# Patient Record
Sex: Female | Born: 1970
Health system: Southern US, Community
[De-identification: ages and names within clinical notes are randomized; demographics above are authoritative.]

## PROBLEM LIST (undated history)

## (undated) DIAGNOSIS — E669 Obesity, unspecified: Secondary | ICD-10-CM

## (undated) DIAGNOSIS — E039 Hypothyroidism, unspecified: Secondary | ICD-10-CM

## (undated) DIAGNOSIS — M2550 Pain in unspecified joint: Secondary | ICD-10-CM

## (undated) DIAGNOSIS — M779 Enthesopathy, unspecified: Secondary | ICD-10-CM

## (undated) DIAGNOSIS — N946 Dysmenorrhea, unspecified: Secondary | ICD-10-CM

## (undated) DIAGNOSIS — M722 Plantar fascial fibromatosis: Secondary | ICD-10-CM

## (undated) DIAGNOSIS — J309 Allergic rhinitis, unspecified: Secondary | ICD-10-CM

## (undated) DIAGNOSIS — E785 Hyperlipidemia, unspecified: Secondary | ICD-10-CM

## (undated) HISTORY — DX: Hypothyroidism, unspecified: E03.9

## (undated) HISTORY — DX: Pain in unspecified joint: M25.50

## (undated) HISTORY — DX: Allergic rhinitis, unspecified: J30.9

## (undated) HISTORY — DX: Plantar fascial fibromatosis: M72.2

## (undated) HISTORY — DX: Hyperlipidemia, unspecified: E78.5

## (undated) HISTORY — DX: Dysmenorrhea, unspecified: N94.6

## (undated) HISTORY — PX: WISDOM TOOTH EXTRACTION: SHX21

## (undated) HISTORY — DX: Enthesopathy, unspecified: M77.9

## (undated) HISTORY — DX: Obesity, unspecified: E66.9

---

## 2015-11-24 ENCOUNTER — Other Ambulatory Visit: Payer: 59

## 2016-01-17 ENCOUNTER — Encounter: Payer: Self-pay | Admitting: Family

## 2016-01-18 ENCOUNTER — Other Ambulatory Visit: Payer: Self-pay | Admitting: Family

## 2016-01-18 DIAGNOSIS — Z1239 Encounter for other screening for malignant neoplasm of breast: Secondary | ICD-10-CM

## 2016-02-08 ENCOUNTER — Encounter: Payer: Self-pay | Admitting: Family

## 2016-02-08 ENCOUNTER — Ambulatory Visit (INDEPENDENT_AMBULATORY_CARE_PROVIDER_SITE_OTHER): Payer: 59 | Admitting: Family

## 2016-02-08 VITALS — BP 107/71 | HR 71 | Temp 97.3°F | Ht 63.0 in | Wt 249.0 lb

## 2016-02-08 DIAGNOSIS — Z6841 Body Mass Index (BMI) 40.0 and over, adult: Secondary | ICD-10-CM

## 2016-02-08 DIAGNOSIS — E039 Hypothyroidism, unspecified: Secondary | ICD-10-CM | POA: Diagnosis not present

## 2016-02-08 DIAGNOSIS — Z0000 Encounter for general adult medical examination without abnormal findings: Secondary | ICD-10-CM | POA: Diagnosis not present

## 2016-02-08 DIAGNOSIS — F5081 Binge eating disorder: Secondary | ICD-10-CM | POA: Insufficient documentation

## 2016-02-08 DIAGNOSIS — J301 Allergic rhinitis due to pollen: Secondary | ICD-10-CM | POA: Diagnosis not present

## 2016-02-08 DIAGNOSIS — J309 Allergic rhinitis, unspecified: Secondary | ICD-10-CM | POA: Insufficient documentation

## 2016-02-08 NOTE — Patient Instructions (Signed)

## 2016-02-08 NOTE — Progress Notes (Signed)
   Subjective:    Patient ID: Chloe Byrd, female    DOB: March 31, 1970, 45 y.o.   MRN: [id number]  PT presents to the office today to establish care and CPE without pap.  Thyroid Problem  Presents for follow-up visit. Patient reports no anxiety, depressed mood, diaphoresis, dry skin or hair loss. The symptoms have been stable.  Binge Eating Disorder PT currently taking Vyvanse 70 mg and is working well.  Allergic Rhinitis PT currently taking Singulair 10 mg and is stable.    Review of Systems  Constitutional: Negative for diaphoresis.  Psychiatric/Behavioral: The patient is not nervous/anxious.   All other systems reviewed and are negative.  Social History   Social History  . Marital status: Married    Spouse name: N/A  . Number of children: N/A  . Years of education: N/A   Social History Main Topics  . Smoking status: Never Smoker  . Smokeless tobacco: Never Used  . Alcohol use No  . Drug use: No  . Sexual activity: Yes   Other Topics Concern  . None   Social History Narrative  . None    Family History  Problem Relation Age of Onset  . Arthritis Mother   . Hypertension Mother   . Diabetes Father   . Alcohol abuse Father   . Depression Father        Objective:   Physical Exam  Constitutional: She is oriented to person, place, and time. She appears well-developed and well-nourished. No distress.  HENT:  Head: Normocephalic and atraumatic.  Right Ear: External ear normal.  Left Ear: External ear normal.  Nose: Nose normal.  Mouth/Throat: Oropharynx is clear and moist.  Eyes: Pupils are equal, round, and reactive to light.  Neck: Normal range of motion. Neck supple. No thyromegaly present.  Cardiovascular: Normal rate, regular rhythm, normal heart sounds and intact distal pulses.   No murmur heard. Pulmonary/Chest: Effort normal and breath sounds normal. No respiratory distress. She has no wheezes.  Abdominal: Soft. Bowel sounds are normal. She  exhibits no distension. There is no tenderness.  Musculoskeletal: Normal range of motion. She exhibits no edema or tenderness.  Neurological: She is alert and oriented to person, place, and time. She has normal reflexes. No cranial nerve deficit.  Skin: Skin is warm and dry.  Psychiatric: She has a normal mood and affect. Her behavior is normal. Judgment and thought content normal.  Vitals reviewed.     BP 107/71   Pulse 71   Temp 97.3 F (36.3 C) (Oral)   Ht 5' 3" (1.6 m)   Wt 249 lb (112.9 kg)   BMI 44.11 kg/m      Assessment & Plan:  1. Hypothyroidism, unspecified type - CMP14+EGFR - Thyroid Panel With TSH  2. Morbid obesity with BMI of 40.0-44.9, adult (HCC) - CMP14+EGFR  3. Binge eating disorder - CMP14+EGFR  4. Non-seasonal allergic rhinitis due to pollen, unspecified chronicity - CMP14+EGFR  5. Annual physical exam - CMP14+EGFR - Thyroid Panel With TSH - CBC with Differential/Platelet - Lipid panel - VITAMIN D 25 Hydroxy (Vit-D Deficiency, Fractures)   Continue all meds Labs pending Health Maintenance reviewed Diet and exercise encouraged RTO 6 months  Evelina Dun, FNP

## 2016-02-09 ENCOUNTER — Other Ambulatory Visit: Payer: Self-pay | Admitting: Family

## 2016-02-09 DIAGNOSIS — E559 Vitamin D deficiency, unspecified: Secondary | ICD-10-CM

## 2016-02-09 DIAGNOSIS — E785 Hyperlipidemia, unspecified: Secondary | ICD-10-CM

## 2016-02-09 LAB — VITAMIN D 25 HYDROXY (VIT D DEFICIENCY, FRACTURES): Vit D, 25-Hydroxy: 11.9 ng/mL — ABNORMAL LOW (ref 30.0–100.0)

## 2016-02-09 LAB — CMP14+EGFR
ALBUMIN: 4.4 g/dL (ref 3.5–5.5)
ALK PHOS: 85 IU/L (ref 39–117)
ALT: 16 IU/L (ref 0–32)
AST: 15 IU/L (ref 0–40)
Albumin/Globulin Ratio: 1.7 (ref 1.2–2.2)
BUN / CREAT RATIO: 11 (ref 9–23)
BUN: 8 mg/dL (ref 6–24)
Bilirubin Total: 0.4 mg/dL (ref 0.0–1.2)
CALCIUM: 9.3 mg/dL (ref 8.7–10.2)
CO2: 23 mmol/L (ref 18–29)
CREATININE: 0.73 mg/dL (ref 0.57–1.00)
Chloride: 102 mmol/L (ref 96–106)
GFR calc Af Amer: 114 mL/min/{1.73_m2} (ref >59)
GFR, EST NON AFRICAN AMERICAN: 99 mL/min/{1.73_m2} (ref >59)
GLUCOSE: 83 mg/dL (ref 65–99)
Globulin, Total: 2.6 g/dL (ref 1.5–4.5)
Potassium: 4.7 mmol/L (ref 3.5–5.2)
Sodium: 141 mmol/L (ref 134–144)
Total Protein: 7 g/dL (ref 6.0–8.5)

## 2016-02-09 LAB — CBC WITH DIFFERENTIAL/PLATELET
BASOS ABS: 0 10*3/uL (ref 0.0–0.2)
Basos: 0 %
EOS (ABSOLUTE): 0.3 10*3/uL (ref 0.0–0.4)
Eos: 2 %
Hematocrit: 39.4 % (ref 34.0–46.6)
Hemoglobin: 13.8 g/dL (ref 11.1–15.9)
Immature Grans (Abs): 0 10*3/uL (ref 0.0–0.1)
Immature Granulocytes: 0 %
LYMPHS ABS: 3.4 10*3/uL — AB (ref 0.7–3.1)
Lymphs: 31 %
MCH: 29.8 pg (ref 26.6–33.0)
MCHC: 35 g/dL (ref 31.5–35.7)
MCV: 85 fL (ref 79–97)
MONOCYTES: 5 %
MONOS ABS: 0.5 10*3/uL (ref 0.1–0.9)
Neutrophils Absolute: 6.6 10*3/uL (ref 1.4–7.0)
Neutrophils: 62 %
Platelets: 367 10*3/uL (ref 150–379)
RBC: 4.63 x10E6/uL (ref 3.77–5.28)
RDW: 14.3 % (ref 12.3–15.4)
WBC: 10.8 10*3/uL (ref 3.4–10.8)

## 2016-02-09 LAB — LIPID PANEL
CHOLESTEROL TOTAL: 272 mg/dL — AB (ref 100–199)
Chol/HDL Ratio: 5.4 ratio units — ABNORMAL HIGH (ref 0.0–4.4)
HDL: 50 mg/dL (ref >39)
LDL Calculated: 174 mg/dL — ABNORMAL HIGH (ref 0–99)
TRIGLYCERIDES: 240 mg/dL — AB (ref 0–149)
VLDL Cholesterol Cal: 48 mg/dL — ABNORMAL HIGH (ref 5–40)

## 2016-02-09 LAB — THYROID PANEL WITH TSH
FREE THYROXINE INDEX: 2.6 (ref 1.2–4.9)
T3 Uptake Ratio: 25 % (ref 24–39)
T4 TOTAL: 10.3 ug/dL (ref 4.5–12.0)
TSH: 1.65 u[IU]/mL (ref 0.450–4.500)

## 2016-02-09 MED ORDER — VITAMIN D (ERGOCALCIFEROL) 1.25 MG (50000 UNIT) PO CAPS: 50000.0000 [IU] | ORAL_CAPSULE | ORAL | 3 refills | Status: DC

## 2016-02-09 MED ORDER — ATORVASTATIN CALCIUM 20 MG PO TABS: 20.0000 mg | ORAL_TABLET | Freq: Every day | ORAL | 3 refills | Status: DC

## 2016-02-20 ENCOUNTER — Other Ambulatory Visit: Payer: Self-pay | Admitting: Family

## 2016-02-20 DIAGNOSIS — N63 Unspecified lump in unspecified breast: Secondary | ICD-10-CM

## 2016-02-20 DIAGNOSIS — N6320 Unspecified lump in the left breast, unspecified quadrant: Secondary | ICD-10-CM

## 2016-02-29 ENCOUNTER — Encounter (INDEPENDENT_AMBULATORY_CARE_PROVIDER_SITE_OTHER): Payer: 59 | Admitting: Family Medicine

## 2016-03-01 ENCOUNTER — Other Ambulatory Visit: Payer: Self-pay | Admitting: Family

## 2016-03-01 MED ORDER — NORGESTREL-ETHINYL ESTRADIOL 0.3-30 MG-MCG PO TABS: 1.0000 | ORAL_TABLET | Freq: Every day | ORAL | 3 refills | Status: DC

## 2016-03-01 MED ORDER — ATORVASTATIN CALCIUM 20 MG PO TABS: 20.0000 mg | ORAL_TABLET | Freq: Every day | ORAL | 3 refills | Status: DC

## 2016-03-01 MED ORDER — MONTELUKAST SODIUM 10 MG PO TABS: 10.0000 mg | ORAL_TABLET | Freq: Every day | ORAL | 3 refills | Status: DC

## 2016-03-01 MED ORDER — LEVOTHYROXINE SODIUM 200 MCG PO TABS: 200.0000 ug | ORAL_TABLET | Freq: Every day | ORAL | 3 refills | Status: DC

## 2016-03-01 MED FILL — MONTELUKAST SOD 10 MG TAB: 10 | 90 days supply | Qty: 90 | Fill #0

## 2016-03-01 MED FILL — ELINEST-28 TABLET: 0.3-30 | 84 days supply | Qty: 84 | Fill #0

## 2016-03-01 MED FILL — VIT D2 1.25 MG (50,000 UNIT: 1.25 MG | 84 days supply | Qty: 12 | Fill #0

## 2016-03-01 MED FILL — LEVOTHYROXINE 200 MCG TAB: 200 | 90 days supply | Qty: 90 | Fill #0

## 2016-03-01 MED FILL — ATORVASTATIN 20 MG TABLET: 20 | 90 days supply | Qty: 90 | Fill #0

## 2016-03-05 ENCOUNTER — Encounter (HOSPITAL_COMMUNITY): Payer: Self-pay

## 2016-03-12 ENCOUNTER — Encounter (HOSPITAL_COMMUNITY): Payer: Self-pay

## 2016-03-20 ENCOUNTER — Encounter (INDEPENDENT_AMBULATORY_CARE_PROVIDER_SITE_OTHER): Payer: 59 | Admitting: Family Medicine

## 2016-03-26 ENCOUNTER — Encounter (INDEPENDENT_AMBULATORY_CARE_PROVIDER_SITE_OTHER): Payer: Self-pay | Admitting: Family Medicine

## 2016-03-26 ENCOUNTER — Ambulatory Visit (INDEPENDENT_AMBULATORY_CARE_PROVIDER_SITE_OTHER): Payer: 59 | Admitting: Family Medicine

## 2016-03-26 VITALS — BP 112/74 | HR 69 | Temp 98.4°F | Resp 13 | Ht 63.0 in | Wt 254.0 lb

## 2016-03-26 DIAGNOSIS — E039 Hypothyroidism, unspecified: Secondary | ICD-10-CM

## 2016-03-26 DIAGNOSIS — Z1389 Encounter for screening for other disorder: Secondary | ICD-10-CM | POA: Diagnosis not present

## 2016-03-26 DIAGNOSIS — R5383 Other fatigue: Secondary | ICD-10-CM

## 2016-03-26 DIAGNOSIS — E782 Mixed hyperlipidemia: Secondary | ICD-10-CM | POA: Diagnosis not present

## 2016-03-26 DIAGNOSIS — R0602 Shortness of breath: Secondary | ICD-10-CM

## 2016-03-26 DIAGNOSIS — E559 Vitamin D deficiency, unspecified: Secondary | ICD-10-CM | POA: Diagnosis not present

## 2016-03-26 DIAGNOSIS — Z9189 Other specified personal risk factors, not elsewhere classified: Secondary | ICD-10-CM | POA: Diagnosis not present

## 2016-03-26 DIAGNOSIS — Z1331 Encounter for screening for depression: Secondary | ICD-10-CM

## 2016-03-26 DIAGNOSIS — Z0289 Encounter for other administrative examinations: Secondary | ICD-10-CM

## 2016-03-26 NOTE — Progress Notes (Signed)
Office: [phone]  /  Fax: [phone]   HPI:   Chief Complaint: OBESITY  Chloe Byrd (MR# [id number]) is a 46 y.o. female who presents on 03/26/2016 for obesity evaluation and treatment. Current BMI is Body mass index is 44.99 kg/m.Marland Kitchen. Chloe Byrd has struggled with obesity for years and has been unsuccessful in either losing weight or maintaining long term weight loss. Chloe Byrd attended our information session and states she is currently in the action stage of change and ready to dedicate time achieving and maintaining a healthier weight.  Chloe Byrd states her family eats meals together she thinks her family will eat healthier with  her she struggles with family and or coworkers weight loss sabotage her desired weight is 160 she has been heavy most of  her life she started gaining weight after [employer] her heaviest weight ever was 255 lbs. she has significant food cravings issues  she snacks frequently in the evenings she skips meals frequently she is frequently drinking liquids with calories she frequently makes poor food choices she has problems with excessive hunger  she frequently eats larger portions than normal  she has binge eating behaviors she struggles with emotional eating    Fatigue Chloe Byrd feels her energy is lower than it should be. This has worsened with weight gain and has not worsened recently. Chloe Byrd denies daytime somnolence and  denies waking up still tired. Patient is at risk for obstructive sleep apnea. Patient generally gets 6 or 7 hours of sleep per night, and states they generally have generally restful sleep. Snoring is not present. Apneic episodes are not present. Epworth Sleepiness Score is 3  Dyspnea on exertion Chloe Byrd notes increasing shortness of breath with exercising and seems to be worsening over time with weight gain. She notes getting out of breath sooner with activity than she used to. This has not gotten worse recently. Chloe Byrd denies  orthopnea.  Vitamin D deficiency Chloe Byrd has a diagnosis of vitamin D deficiency. Admits fatigue, last level was very low at 11.9, She is currently taking vit D and denies nausea, vomiting or muscle weakness.  Hyperlipidemia Chloe Byrd has mixed hyperlipidemia, last LDL elevated at 174, triglycerides elevated at 240 and has been trying to improve her cholesterol levels with intensive lifestyle modification including a low saturated fat diet, exercise and weight loss. Is currently on Lipitor. She denies any chest pain, claudication or myalgias.  Hypothyroid Continued on Levothyroxine, denies heat/cold intolerance.  Depression Screen Chloe Byrd's Food and Mood (modified PHQ-9) score was  Depression screen PHQ 2/9 03/26/2016  Decreased Interest 1  Down, Depressed, Hopeless 3  PHQ - 2 Score 4  Altered sleeping 3  Tired, decreased energy 3  Change in appetite 2  Feeling bad or failure about yourself  0  Trouble concentrating 0  Moving slowly or fidgety/restless 0  Suicidal thoughts 0  PHQ-9 Score 12    ALLERGIES: Allergies  Allergen Reactions  . Hydrocodone     MEDICATIONS: Current Outpatient Prescriptions on File Prior to Visit  Medication Sig Dispense Refill  . atorvastatin (LIPITOR) 20 MG tablet Take 1 tablet (20 mg total) by mouth daily. 90 tablet 3  . levothyroxine (SYNTHROID, LEVOTHROID) 200 MCG tablet Take 1 tablet (200 mcg total) by mouth daily before breakfast. 90 tablet 3  . montelukast (SINGULAIR) 10 MG tablet Take 1 tablet (10 mg total) by mouth at bedtime. 90 tablet 3  . norgestrel-ethinyl estradiol (LO/OVRAL,CRYSELLE) 0.3-30 MG-MCG tablet Take 1 tablet by mouth daily. 3 Package 3  .  Vitamin D, Ergocalciferol, (DRISDOL) 50000 units CAPS capsule Take 1 capsule (50,000 Units total) by mouth every 7 (seven) days. 12 capsule 3  . lisdexamfetamine (VYVANSE) 70 MG capsule Take 70 mg by mouth daily.     No current facility-administered medications on file prior to visit.     PAST  MEDICAL HISTORY: Past Medical History:  Diagnosis Date  . Allergic rhinitis   . Dysmenorrhea   . Hyperlipidemia   . Hypothyroidism   . Joint pain   . Obesity   . Plantar fasciitis   . Tendonitis     PAST SURGICAL HISTORY: Past Surgical History:  Procedure Laterality Date  . CESAREAN SECTION  2008  . WISDOM TOOTH EXTRACTION     age 78    SOCIAL HISTORY: Social History  Substance Use Topics  . Smoking status: Never Smoker  . Smokeless tobacco: Never Used  . Alcohol use No    FAMILY HISTORY: Family History  Problem Relation Age of Onset  . Arthritis Mother   . Hypertension Mother   . Anxiety disorder Mother   . Sleep apnea Mother   . Diabetes Father   . Alcohol abuse Father   . Depression Father   . Hyperlipidemia Father     ROS: Review of Systems  Constitutional: Positive for malaise/fatigue.  HENT:       Hay Fever  Eyes: Positive for redness.       Wear Glasses or Contacts  Cardiovascular:       Dyspnea on Exertion  Gastrointestinal: Positive for diarrhea and heartburn.  Musculoskeletal: Positive for joint pain and myalgias.       Breast Lumps  Skin:       Hair or Nail Changes  Psychiatric/Behavioral: The patient has insomnia.        Stress    PHYSICAL EXAM: Blood pressure 112/74, pulse 69, temperature 98.4 F (36.9 C), temperature source Oral, resp. rate 13, height 5\' 3"  (1.6 m), weight 254 lb (115.2 kg), last menstrual period 03/25/2016, SpO2 98 %. Body mass index is 44.99 kg/m. Physical Exam  Constitutional: She is oriented to person, place, and time. She appears well-developed and well-nourished.  HENT:  Head: Normocephalic and atraumatic.  Nose: Nose normal.  Eyes: EOM are normal. No scleral icterus.  Neck: Normal range of motion. Neck supple. No thyromegaly present.  Cardiovascular: Normal rate and regular rhythm.   Pulmonary/Chest: No respiratory distress.  Abdominal: Soft. There is no tenderness.  Obesity  Musculoskeletal: Normal  range of motion. She exhibits edema.  Neurological: She is alert and oriented to person, place, and time. Coordination normal.  Skin: Skin is warm and dry.  Psychiatric: She has a normal mood and affect. Her behavior is normal.    RECENT LABS AND TESTS: BMET    Component Value Date/Time   NA 141 02/08/2016 1026   K 4.7 02/08/2016 1026   CL 102 02/08/2016 1026   CO2 23 02/08/2016 1026   GLUCOSE 83 02/08/2016 1026   BUN 8 02/08/2016 1026   CREATININE 0.73 02/08/2016 1026   CALCIUM 9.3 02/08/2016 1026   GFRNONAA 99 02/08/2016 1026   GFRAA 114 02/08/2016 1026   No results found for: HGBA1C No results found for: INSULIN CBC    Component Value Date/Time   WBC 10.8 02/08/2016 1026   RBC 4.63 02/08/2016 1026   HCT 39.4 02/08/2016 1026   PLT 367 02/08/2016 1026   MCV 85 02/08/2016 1026   MCH 29.8 02/08/2016 1026   MCHC 35.0 02/08/2016  1026   RDW 14.3 02/08/2016 1026   LYMPHSABS 3.4 (H) 02/08/2016 1026   EOSABS 0.3 02/08/2016 1026   BASOSABS 0.0 02/08/2016 1026   Iron/TIBC/Ferritin/ %Sat No results found for: IRON, TIBC, FERRITIN, IRONPCTSAT Lipid Panel     Component Value Date/Time   CHOL 272 (H) 02/08/2016 1026   TRIG 240 (H) 02/08/2016 1026   HDL 50 02/08/2016 1026   CHOLHDL 5.4 (H) 02/08/2016 1026   LDLCALC 174 (H) 02/08/2016 1026   Hepatic Function Panel     Component Value Date/Time   PROT 7.0 02/08/2016 1026   ALBUMIN 4.4 02/08/2016 1026   AST 15 02/08/2016 1026   ALT 16 02/08/2016 1026   ALKPHOS 85 02/08/2016 1026   BILITOT 0.4 02/08/2016 1026      Component Value Date/Time   TSH 1.650 02/08/2016 1026    ECG  shows NSR with a rate of 63 BPM INDIRECT CALORIMETER done today shows a VO2 of 221 and a REE of 1535.    ASSESSMENT AND PLAN: Other fatigue - Plan: EKG 12-Lead, Comprehensive metabolic panel, CBC with Differential/Platelet, Hemoglobin A1c, Insulin, random, VITAMIN D 25 Hydroxy (Vit-D Deficiency, Fractures), Vitamin B12, Folate, T3, T4, free,  TSH  Shortness of breath - Plan: Lipid Panel With LDL/HDL Ratio  Vitamin D deficiency - Plan: VITAMIN D 25 Hydroxy (Vit-D Deficiency, Fractures)  Mixed hyperlipidemia - Plan: Lipid Panel With LDL/HDL Ratio  Hypothyroidism, unspecified type  At risk for diabetes mellitus  Depression screening  Morbid obesity (HCC)  PLAN:  Fatigue Chloe Byrd was informed that her fatigue may be related to obesity, depression or many other causes. Labs will be ordered, and in the meanwhile Chloe Byrd has agreed to work on diet, exercise and weight loss to help with fatigue. Proper sleep hygiene was discussed including the need for 7-8 hours of quality sleep each night. A sleep study was not ordered based on symptoms and Epworth score.  Exercise Induced Shortness of Breath Chloe Byrd's shortness of breath appears to be obesity related and exercise induced. She has agreed to work on weight loss and gradually increase exercise to treat her exercise induced shortness of breath. If Chloe Byrd follows our instructions and loses weight without improvement of her shortness of breath, we will plan to refer to pulmonology. We will monitor this condition regularly. Chloe Byrd agrees to this plan.  Vitamin D Deficiency Chloe Byrd was informed that low vitamin D levels contributes to fatigue and are associated with obesity, breast, and colon cancer. She agrees to continue to take prescription Vit D @50 ,000 IU every week, will check labs and will follow up for routine testing of vitamin D, at least 2-3 times per year. She was informed of the risk of over-replacement of vitamin D and agrees to not increase her dose unless he discusses this with [place] first.  Hyperlipidemia Chloe Byrd was informed of the American Heart Association Guidelines emphasize intensive lifestyle modifications as the first line treatment for hyperlipidemia. We discussed many lifestyle modifications today in depth, and Chloe Byrd will continue to work on decreasing saturated fats such as  fatty red meat, butter and many fried foods. She will also increase vegetables and lean protein in her diet and continue to work on exercise and her weight loss efforts. Will check labs.  Hypothyroid  Will continue on Levothyroxine and check labs.  Depression Screen Chloe Byrd had a positive depression screening. Depression is commonly associated with obesity and often results in emotional eating behaviors. We will monitor this closely and work on CBT to help  improve the non-hunger eating patterns. Referral to Psychology may be required if no improvement is seen as she continues in our clinic.  Diabetes risk counselling Chloe Byrd was given extended (at least 15 minutes) diabetes prevention counseling today. She is 46 y.o. female and has risk factors for diabetes including obesity. We discussed intensive lifestyle modifications today with an emphasis on weight loss as well as increasing exercise and decreasing simple carbohydrates in her diet.  Obesity Chloe Byrd is currently in the action stage of change and her goal is to continue with weight loss efforts She has agreed to follow the Category 2 plan +100 calories. Chloe Byrd has been instructed to work up to a goal of 150 minutes of combined cardio and strengthening exercise per week for weight loss and overall health benefits. We discussed the following Behavioral Modification Stratagies today: increasing lean protein intake, decreasing simple carbohydrates , increasing vegetables and work on meal planning and easy cooking plans  Chloe Byrd has agreed to follow up with our clinic in 2 weeks. She was informed of the importance of frequent follow up visits to maximize her success with intensive lifestyle modifications for her multiple health conditions. She was informed we would discuss her lab results at her next visit unless there is a critical issue that needs to be addressed sooner. Chloe Byrd agreed to keep her next visit at the agreed upon time to discuss these  results.  I, [place] Crane, am acting as scribe for Quillian Quince, MD  I have reviewed the above documentation for accuracy and completeness, and I agree with the above. -Quillian Quince, MD

## 2016-03-27 LAB — COMPREHENSIVE METABOLIC PANEL
ALT: 14 IU/L (ref 0–32)
AST: 10 IU/L (ref 0–40)
Albumin/Globulin Ratio: 1.7 (ref 1.2–2.2)
Albumin: 4.5 g/dL (ref 3.5–5.5)
Alkaline Phosphatase: 80 IU/L (ref 39–117)
BUN/Creatinine Ratio: 15 (ref 9–23)
BUN: 12 mg/dL (ref 6–24)
Bilirubin Total: 0.3 mg/dL (ref 0.0–1.2)
CALCIUM: 9.2 mg/dL (ref 8.7–10.2)
CO2: 24 mmol/L (ref 18–29)
CREATININE: 0.79 mg/dL (ref 0.57–1.00)
Chloride: 99 mmol/L (ref 96–106)
GFR, EST AFRICAN AMERICAN: 104 mL/min/{1.73_m2} (ref >59)
GFR, EST NON AFRICAN AMERICAN: 90 mL/min/{1.73_m2} (ref >59)
GLUCOSE: 81 mg/dL (ref 65–99)
Globulin, Total: 2.7 g/dL (ref 1.5–4.5)
Potassium: 4.2 mmol/L (ref 3.5–5.2)
Sodium: 141 mmol/L (ref 134–144)
TOTAL PROTEIN: 7.2 g/dL (ref 6.0–8.5)

## 2016-03-27 LAB — HEMOGLOBIN A1C
ESTIMATED AVERAGE GLUCOSE: 120 mg/dL
HEMOGLOBIN A1C: 5.8 % — AB (ref 4.8–5.6)

## 2016-03-27 LAB — LIPID PANEL WITH LDL/HDL RATIO
CHOLESTEROL TOTAL: 191 mg/dL (ref 100–199)
HDL: 49 mg/dL (ref >39)
LDL CALC: 110 mg/dL — AB (ref 0–99)
LDl/HDL Ratio: 2.2 ratio units (ref 0.0–3.2)
Triglycerides: 160 mg/dL — ABNORMAL HIGH (ref 0–149)
VLDL Cholesterol Cal: 32 mg/dL (ref 5–40)

## 2016-03-27 LAB — CBC WITH DIFFERENTIAL/PLATELET
BASOS ABS: 0 10*3/uL (ref 0.0–0.2)
Basos: 0 %
EOS (ABSOLUTE): 0.2 10*3/uL (ref 0.0–0.4)
Eos: 2 %
Hematocrit: 37.9 % (ref 34.0–46.6)
Hemoglobin: 12.6 g/dL (ref 11.1–15.9)
IMMATURE GRANS (ABS): 0 10*3/uL (ref 0.0–0.1)
IMMATURE GRANULOCYTES: 0 %
LYMPHS: 28 %
Lymphocytes Absolute: 3 10*3/uL (ref 0.7–3.1)
MCH: 29.1 pg (ref 26.6–33.0)
MCHC: 33.2 g/dL (ref 31.5–35.7)
MCV: 88 fL (ref 79–97)
MONOCYTES: 6 %
Monocytes Absolute: 0.6 10*3/uL (ref 0.1–0.9)
NEUTROS ABS: 6.9 10*3/uL (ref 1.4–7.0)
NEUTROS PCT: 64 %
PLATELETS: 429 10*3/uL — AB (ref 150–379)
RBC: 4.33 x10E6/uL (ref 3.77–5.28)
RDW: 14.3 % (ref 12.3–15.4)
WBC: 10.7 10*3/uL (ref 3.4–10.8)

## 2016-03-27 LAB — TSH: TSH: 2.11 u[IU]/mL (ref 0.450–4.500)

## 2016-03-27 LAB — INSULIN, RANDOM: INSULIN: 23.4 u[IU]/mL (ref 2.6–24.9)

## 2016-03-27 LAB — T4, FREE: Free T4: 1.76 ng/dL (ref 0.82–1.77)

## 2016-03-27 LAB — FOLATE: FOLATE: 11.3 ng/mL (ref >3.0)

## 2016-03-27 LAB — VITAMIN B12: VITAMIN B 12: 256 pg/mL (ref 232–1245)

## 2016-03-27 LAB — VITAMIN D 25 HYDROXY (VIT D DEFICIENCY, FRACTURES): Vit D, 25-Hydroxy: 22.4 ng/mL — ABNORMAL LOW (ref 30.0–100.0)

## 2016-03-27 LAB — T3: T3 TOTAL: 122 ng/dL (ref 71–180)

## 2016-04-11 ENCOUNTER — Ambulatory Visit (INDEPENDENT_AMBULATORY_CARE_PROVIDER_SITE_OTHER): Payer: 59 | Admitting: Family Medicine

## 2016-04-15 ENCOUNTER — Encounter (INDEPENDENT_AMBULATORY_CARE_PROVIDER_SITE_OTHER): Payer: Self-pay

## 2016-04-15 ENCOUNTER — Ambulatory Visit
Admission: RE | Admit: 2016-04-15 | Discharge: 2016-04-15 | Disposition: A | Discharge status: Home / Self Care | Payer: 59 | Source: Ambulatory Visit | Attending: Family | Admitting: Family

## 2016-04-15 ENCOUNTER — Encounter (INDEPENDENT_AMBULATORY_CARE_PROVIDER_SITE_OTHER): Payer: Self-pay | Admitting: Family Medicine

## 2016-04-15 DIAGNOSIS — N63 Unspecified lump in unspecified breast: Secondary | ICD-10-CM

## 2016-04-15 DIAGNOSIS — N6320 Unspecified lump in the left breast, unspecified quadrant: Secondary | ICD-10-CM

## 2016-04-15 DIAGNOSIS — R928 Other abnormal and inconclusive findings on diagnostic imaging of breast: Secondary | ICD-10-CM | POA: Diagnosis not present

## 2016-04-15 DIAGNOSIS — N6322 Unspecified lump in the left breast, upper inner quadrant: Secondary | ICD-10-CM | POA: Diagnosis not present

## 2016-04-15 NOTE — Telephone Encounter (Signed)
Please review message for the patient as she states wants the status of her inclement weather cancelled appt to reflect that not a 'no show'.

## 2016-04-18 ENCOUNTER — Ambulatory Visit (INDEPENDENT_AMBULATORY_CARE_PROVIDER_SITE_OTHER): Payer: 59 | Admitting: Family Medicine

## 2016-04-18 VITALS — BP 113/72 | HR 64 | Temp 98.3°F | Ht 63.0 in | Wt 249.0 lb

## 2016-04-18 DIAGNOSIS — R7303 Prediabetes: Secondary | ICD-10-CM

## 2016-04-18 DIAGNOSIS — Z9189 Other specified personal risk factors, not elsewhere classified: Secondary | ICD-10-CM | POA: Diagnosis not present

## 2016-04-18 MED ORDER — METFORMIN HCL 500 MG PO TABS: 500.0000 mg | ORAL_TABLET | Freq: Every day | ORAL | 1 refills | Status: DC

## 2016-04-19 MED FILL — metFORMIN HCL 500 MG TABS: 500 | 30 days supply | Qty: 30 | Fill #0

## 2016-04-22 NOTE — Progress Notes (Signed)
Office: [phone]  /  Fax: [phone]   HPI:   Chief Complaint: OBESITY Chloe Byrd is here to discuss her progress with her obesity treatment plan. She is following her eating plan approximately 80 % of the time and states she is exercising 0 minutes 0 times per week.  Chloe Byrd has done well with weight loss but has deviated from the plan significantly after the first week. Chloe Byrd is currently struggling with plan boredom, especially with dinner.  Her weight is 249 lb (112.9 kg) today and has had a weight loss of 5 pounds over a period of 3 weeks since her last visit. She has lost 5 lbs since starting treatment with [place].  Pre-Diabetes Chloe Byrd has a diagnosis of prediabetes based on her elevated HgA1c at 5.8 and was informed this puts her at greater risk of developing diabetes. She is not taking metformin currently and continues to work on diet and exercise to decrease risk of diabetes. She reports polyphagia and denies nausea or hypoglycemia. She has a positive family history of diabetes.  At risk for diabetes Chloe Byrd is at higher than average risk for developing diabetes due to her obesity and pre-diabetes. She currently denies polyuria or polydipsia.   Wt Readings from Last 500 Encounters:  04/18/16 249 lb (112.9 kg)  03/26/16 254 lb (115.2 kg)  02/08/16 249 lb (112.9 kg)     ALLERGIES: Allergies  Allergen Reactions  . Hydrocodone     MEDICATIONS: Current Outpatient Prescriptions on File Prior to Visit  Medication Sig Dispense Refill  . atorvastatin (LIPITOR) 20 MG tablet Take 1 tablet (20 mg total) by mouth daily. 90 tablet 3  . levothyroxine (SYNTHROID, LEVOTHROID) 200 MCG tablet Take 1 tablet (200 mcg total) by mouth daily before breakfast. 90 tablet 3  . montelukast (SINGULAIR) 10 MG tablet Take 1 tablet (10 mg total) by mouth at bedtime. 90 tablet 3  . norgestrel-ethinyl estradiol (LO/OVRAL,CRYSELLE) 0.3-30 MG-MCG tablet Take 1 tablet by mouth daily. 3 Package 3  . Vitamin D,  Ergocalciferol, (DRISDOL) 50000 units CAPS capsule Take 1 capsule (50,000 Units total) by mouth every 7 (seven) days. 12 capsule 3   No current facility-administered medications on file prior to visit.     PAST MEDICAL HISTORY: Past Medical History:  Diagnosis Date  . Allergic rhinitis   . Dysmenorrhea   . Hyperlipidemia   . Hypothyroidism   . Joint pain   . Obesity   . Plantar fasciitis   . Tendonitis     PAST SURGICAL HISTORY: Past Surgical History:  Procedure Laterality Date  . CESAREAN SECTION  2008  . WISDOM TOOTH EXTRACTION     age 1    SOCIAL HISTORY: Social History  Substance Use Topics  . Smoking status: Never Smoker  . Smokeless tobacco: Never Used  . Alcohol use No    FAMILY HISTORY: Family History  Problem Relation Age of Onset  . Arthritis Mother   . Hypertension Mother   . Anxiety disorder Mother   . Sleep apnea Mother   . Diabetes Father   . Alcohol abuse Father   . Depression Father   . Hyperlipidemia Father     ROS: Review of Systems  Constitutional: Positive for weight loss.  Gastrointestinal: Negative for nausea.  Genitourinary: Negative for frequency.  Endo/Heme/Allergies: Negative for polydipsia.       Positive polyphagia    PHYSICAL EXAM: Blood pressure 113/72, pulse 64, temperature 98.3 F (36.8 C), temperature source Oral, height 5\' 3"  (1.6 m), weight  249 lb (112.9 kg), last menstrual period 03/25/2016, SpO2 98 %. Body mass index is 44.11 kg/m. Physical Exam  Constitutional: She is oriented to person, place, and time. She appears well-developed and well-nourished.  Cardiovascular: Normal rate.   Pulmonary/Chest: Effort normal.  Musculoskeletal: Normal range of motion.  Neurological: She is oriented to person, place, and time.  Skin: Skin is warm and dry.  Psychiatric: She has a normal mood and affect. Her behavior is normal.    RECENT LABS AND TESTS: BMET    Component Value Date/Time   NA 141 03/26/2016 1136   K 4.2  03/26/2016 1136   CL 99 03/26/2016 1136   CO2 24 03/26/2016 1136   GLUCOSE 81 03/26/2016 1136   BUN 12 03/26/2016 1136   CREATININE 0.79 03/26/2016 1136   CALCIUM 9.2 03/26/2016 1136   GFRNONAA 90 03/26/2016 1136   GFRAA 104 03/26/2016 1136   Lab Results  Component Value Date   HGBA1C 5.8 (H) 03/26/2016   Lab Results  Component Value Date   INSULIN 23.4 03/26/2016   CBC    Component Value Date/Time   WBC 10.7 03/26/2016 1136   RBC 4.33 03/26/2016 1136   HCT 37.9 03/26/2016 1136   PLT 429 (H) 03/26/2016 1136   MCV 88 03/26/2016 1136   MCH 29.1 03/26/2016 1136   MCHC 33.2 03/26/2016 1136   RDW 14.3 03/26/2016 1136   LYMPHSABS 3.0 03/26/2016 1136   EOSABS 0.2 03/26/2016 1136   BASOSABS 0.0 03/26/2016 1136   Iron/TIBC/Ferritin/ %Sat No results found for: IRON, TIBC, FERRITIN, IRONPCTSAT Lipid Panel     Component Value Date/Time   CHOL 191 03/26/2016 1136   TRIG 160 (H) 03/26/2016 1136   HDL 49 03/26/2016 1136   CHOLHDL 5.4 (H) 02/08/2016 1026   LDLCALC 110 (H) 03/26/2016 1136   Hepatic Function Panel     Component Value Date/Time   PROT 7.2 03/26/2016 1136   ALBUMIN 4.5 03/26/2016 1136   AST 10 03/26/2016 1136   ALT 14 03/26/2016 1136   ALKPHOS 80 03/26/2016 1136   BILITOT 0.3 03/26/2016 1136      Component Value Date/Time   TSH 2.110 03/26/2016 1136   TSH 1.650 02/08/2016 1026    ASSESSMENT AND PLAN: Prediabetes - Plan: metFORMIN (GLUCOPHAGE) 500 MG tablet  At risk for diabetes mellitus  Morbid obesity (HCC)  PLAN:    Obesity Chloe Byrd is currently in the action stage of change. As such, her goal is to continue with weight loss efforts. She has agreed to keep a food journal with 350-500 calories and 30 grams of protein at supper daily and follow the Category 2 plan. Stori has been instructed to work up to a goal of 150 minutes of combined cardio and strengthening exercise per week for weight loss and overall health benefits. We discussed the  following Behavioral Modification Stratagies today: increasing lean protein intake, decreasing simple carbohydrates , increasing vegetables and increasing lower sugar fruits   Pre-Diabetes Chloe Byrd will continue to work on weight loss, exercise, and decreasing simple carbohydrates in her diet to help decrease the risk of diabetes. We dicussed metformin including benefits and risks. She was informed that eating too many simple carbohydrates or too many calories at one sitting increases the likelihood of GI side effects. Chloe Byrd agrees to start to take metformin 500 mg every morning #30 with no refills. Chloe Byrd agreed to follow up with [place] as directed to monitor her progress.  Diabetes risk counselling Chloe Byrd was given extended (at least  30 minutes) diabetes prevention counseling today. She is 46 y.o. female and has risk factors for diabetes including obesity. We discussed intensive lifestyle modifications today with an emphasis on weight loss as well as increasing exercise and decreasing simple carbohydrates in her diet.   Chloe Byrd has agreed to follow up with our clinic in 2 weeks. She was informed of the importance of frequent follow up visits to maximize her success with intensive lifestyle modifications for her multiple health conditions.  I, [place] CraneJoanne Murray, am acting as scribe for Quillian Quincearen Rosamaria Donn, MD  I have reviewed the above documentation for accuracy and completeness, and I agree with the above. -Quillian Quincearen Olevia Westervelt, MD

## 2016-04-25 ENCOUNTER — Ambulatory Visit (INDEPENDENT_AMBULATORY_CARE_PROVIDER_SITE_OTHER): Payer: 59 | Admitting: Family Medicine

## 2016-05-06 ENCOUNTER — Ambulatory Visit (INDEPENDENT_AMBULATORY_CARE_PROVIDER_SITE_OTHER): Payer: 59 | Admitting: Family Medicine

## 2016-05-06 VITALS — BP 109/73 | HR 69 | Temp 98.2°F | Ht 63.0 in | Wt 248.0 lb

## 2016-05-06 DIAGNOSIS — R7303 Prediabetes: Secondary | ICD-10-CM

## 2016-05-06 NOTE — Progress Notes (Signed)
Office: [phone]  /  Fax: [phone]   HPI:   Chief Complaint: OBESITY Chloe Byrd is here to discuss her progress with her obesity treatment plan. She is following her eating plan approximately 50 % of the time and states she is exercising 0 minutes 0 times per week. Patient continues to lose weight even with an increase in challenges and changes in routine. She is trying to journal for dinner but is struggle to find foods the family likes that meet her criteria.   Her weight is 248 lb (112.5 kg) today and has had a weight loss of 1 pounds over a period of 2 weeks since her last visit. She has lost 1 lbs since starting treatment with [place].  Pre-Diabetes Chloe Byrd has a diagnosis of prediabetes based on her elevated HgA1c and was informed this puts her at greater risk of developing diabetes. She is taking metformin and noted mild GI upset so she cut the dose in half and this have improved, currently and continues to work on diet and exercise to decrease risk of diabetes. She denies nausea or hypoglycemia.    Wt Readings from Last 500 Encounters:  05/06/16 248 lb (112.5 kg)  04/18/16 249 lb (112.9 kg)  03/26/16 254 lb (115.2 kg)  02/08/16 249 lb (112.9 kg)     ALLERGIES: Allergies  Allergen Reactions  . Hydrocodone     MEDICATIONS: Current Outpatient Prescriptions on File Prior to Visit  Medication Sig Dispense Refill  . atorvastatin (LIPITOR) 20 MG tablet Take 1 tablet (20 mg total) by mouth daily. 90 tablet 3  . levothyroxine (SYNTHROID, LEVOTHROID) 200 MCG tablet Take 1 tablet (200 mcg total) by mouth daily before breakfast. 90 tablet 3  . metFORMIN (GLUCOPHAGE) 500 MG tablet Take 1 tablet (500 mg total) by mouth daily with breakfast. 30 tablet 1  . montelukast (SINGULAIR) 10 MG tablet Take 1 tablet (10 mg total) by mouth at bedtime. 90 tablet 3  . norgestrel-ethinyl estradiol (LO/OVRAL,CRYSELLE) 0.3-30 MG-MCG tablet Take 1 tablet by mouth daily. 3 Package 3  . Vitamin D,  Ergocalciferol, (DRISDOL) 50000 units CAPS capsule Take 1 capsule (50,000 Units total) by mouth every 7 (seven) days. 12 capsule 3   No current facility-administered medications on file prior to visit.     PAST MEDICAL HISTORY: Past Medical History:  Diagnosis Date  . Allergic rhinitis   . Dysmenorrhea   . Hyperlipidemia   . Hypothyroidism   . Joint pain   . Obesity   . Plantar fasciitis   . Tendonitis     PAST SURGICAL HISTORY: Past Surgical History:  Procedure Laterality Date  . CESAREAN SECTION  2008  . WISDOM TOOTH EXTRACTION     age 46    SOCIAL HISTORY: Social History  Substance Use Topics  . Smoking status: Never Smoker  . Smokeless tobacco: Never Used  . Alcohol use No    FAMILY HISTORY: Family History  Problem Relation Age of Onset  . Arthritis Mother   . Hypertension Mother   . Anxiety disorder Mother   . Sleep apnea Mother   . Diabetes Father   . Alcohol abuse Father   . Depression Father   . Hyperlipidemia Father     ROS: Review of Systems  Constitutional: Positive for weight loss.  All other systems reviewed and are negative.   PHYSICAL EXAM: Blood pressure 109/73, pulse 69, temperature 98.2 F (36.8 C), temperature source Oral, height 5\' 3"  (1.6 m), weight 248 lb (112.5 kg), last menstrual  period 04/22/2016, SpO2 100 %. Body mass index is 43.93 kg/m. Physical Exam  Constitutional: She is oriented to person, place, and time. She appears well-developed and well-nourished.  HENT:  Head: Normocephalic.  Eyes: EOM are normal.  Neck: Normal range of motion.  Cardiovascular: Normal rate and regular rhythm.   Pulmonary/Chest: Effort normal and breath sounds normal.  Abdominal: Soft.  Musculoskeletal: Normal range of motion.  Neurological: She is oriented to person, place, and time.  Skin: Skin is warm and dry.  Psychiatric: She has a normal mood and affect. Her behavior is normal.  Vitals reviewed.   RECENT LABS AND TESTS: BMET      Component Value Date/Time   NA 141 03/26/2016 1136   K 4.2 03/26/2016 1136   CL 99 03/26/2016 1136   CO2 24 03/26/2016 1136   GLUCOSE 81 03/26/2016 1136   BUN 12 03/26/2016 1136   CREATININE 0.79 03/26/2016 1136   CALCIUM 9.2 03/26/2016 1136   GFRNONAA 90 03/26/2016 1136   GFRAA 104 03/26/2016 1136   Lab Results  Component Value Date   HGBA1C 5.8 (H) 03/26/2016   Lab Results  Component Value Date   INSULIN 23.4 03/26/2016   CBC    Component Value Date/Time   WBC 10.7 03/26/2016 1136   RBC 4.33 03/26/2016 1136   HCT 37.9 03/26/2016 1136   PLT 429 (H) 03/26/2016 1136   MCV 88 03/26/2016 1136   MCH 29.1 03/26/2016 1136   MCHC 33.2 03/26/2016 1136   RDW 14.3 03/26/2016 1136   LYMPHSABS 3.0 03/26/2016 1136   EOSABS 0.2 03/26/2016 1136   BASOSABS 0.0 03/26/2016 1136   Iron/TIBC/Ferritin/ %Sat No results found for: IRON, TIBC, FERRITIN, IRONPCTSAT Lipid Panel     Component Value Date/Time   CHOL 191 03/26/2016 1136   TRIG 160 (H) 03/26/2016 1136   HDL 49 03/26/2016 1136   CHOLHDL 5.4 (H) 02/08/2016 1026   LDLCALC 110 (H) 03/26/2016 1136   Hepatic Function Panel     Component Value Date/Time   PROT 7.2 03/26/2016 1136   ALBUMIN 4.5 03/26/2016 1136   AST 10 03/26/2016 1136   ALT 14 03/26/2016 1136   ALKPHOS 80 03/26/2016 1136   BILITOT 0.3 03/26/2016 1136      Component Value Date/Time   TSH 2.110 03/26/2016 1136   TSH 1.650 02/08/2016 1026    ASSESSMENT AND PLAN: Prediabetes  Morbid obesity (HCC)  PLAN: Pre-Diabetes Brexley will continue to work on weight loss, exercise, and decreasing simple carbohydrates in her diet to help decrease the risk of diabetes. We dicussed metformin including benefits and risks. She was informed that eating too many simple carbohydrates or too many calories at one sitting increases the likelihood of GI side effects. Jelitza agreed to follow up with [place] as directed to monitor her progress. Advised to take her metformin at lunch and  follow.   Obesity Lujuana is currently in the action stage of change. As such, her goal is to continue with weight loss efforts She has agreed to follow the Category 2 plan Shizuko has been instructed to work up to a goal of 150 minutes of combined cardio and strengthening exercise per week for weight loss and overall health benefits. She will start with walking 15-20 mins 2-3 times a week.  We discussed the following Behavioral Modification Stratagies today: increasing lean protein intake, decreasing simple carbohydrates  and increasing lower sugar fruits  We spent > than 50% of the 15 minute visit on the counseling as  documented in the note.  Lawanna Kobusngel has agreed to follow up with our clinic in 2 weeks. She was informed of the importance of frequent follow up visits to maximize her success with intensive lifestyle modifications for her multiple health conditions.  I, April Moore , am acting as Neurosurgeonscribe for Quillian Quincearen Sevanna Ballengee, MD  I have reviewed the above documentation for accuracy and completeness, and I agree with the above. -Quillian Quincearen Kino Dunsworth, MD

## 2016-05-10 ENCOUNTER — Other Ambulatory Visit: Payer: Self-pay | Admitting: Family Medicine

## 2016-05-10 MED ORDER — DOXYCYCLINE HYCLATE 100 MG PO TABS: 100.0000 mg | ORAL_TABLET | Freq: Two times a day (BID) | ORAL | 0 refills | Status: DC

## 2016-05-17 MED FILL — metFORMIN HCL 500 MG TABS: 500 | 30 days supply | Qty: 30 | Fill #1

## 2016-05-17 MED FILL — ELINEST-28 TABLET: 0.3-30 | 84 days supply | Qty: 84 | Fill #1

## 2016-05-17 MED FILL — VIT D2 1.25 MG (50,000 UNIT: 1.25 MG | 84 days supply | Qty: 12 | Fill #1

## 2016-05-21 ENCOUNTER — Ambulatory Visit (INDEPENDENT_AMBULATORY_CARE_PROVIDER_SITE_OTHER): Payer: 59 | Admitting: Family Medicine

## 2016-05-21 VITALS — BP 108/69 | HR 67 | Temp 98.2°F | Ht 63.0 in | Wt 250.0 lb

## 2016-05-21 DIAGNOSIS — IMO0001 Reserved for inherently not codable concepts without codable children: Secondary | ICD-10-CM | POA: Insufficient documentation

## 2016-05-21 DIAGNOSIS — F329 Major depressive disorder, single episode, unspecified: Secondary | ICD-10-CM | POA: Diagnosis not present

## 2016-05-21 DIAGNOSIS — F32A Depression, unspecified: Secondary | ICD-10-CM

## 2016-05-21 MED ORDER — BUPROPION HCL ER (SR) 150 MG PO TB12: 150.0000 mg | ORAL_TABLET | Freq: Every morning | ORAL | 0 refills | Status: DC

## 2016-05-21 NOTE — Progress Notes (Signed)
Office: [phone]  /  Fax: [phone]   HPI:   Chief Complaint: OBESITY Chloe Byrd is here to discuss her progress with her obesity treatment plan. She is following her eating plan approximately 50 % of the time and states she is walking and doing yard work for exercise. Chloe Byrd notes increased cravings and feeling somewhat obsessive about food. She has been off track but is ready to discuss ways to get back on track. Her weight is 250 lb (113.4 kg) today and has had a weight gain of 2 lbs over a period of 2 weeks since her last visit. She has lost 4 lbs since starting treatment with [place].  Depression with emotional eating behaviors Chloe Byrd is struggling with increased emotional eating especially with increased stress at work, feeling eating is out of control. She is frustrated that she can't stop thinking about food. She has been working on behavior modification techniques to help reduce her emotional eating and has been unsuccessful. She shows no sign of suicidal or homicidal ideations.  Depression screen Chloe Byrd 2/9 03/26/2016 02/08/2016  Decreased Interest 1 0  Down, Depressed, Hopeless 3 0  PHQ - 2 Score 4 0  Altered sleeping 3 -  Tired, decreased energy 3 -  Change in appetite 2 -  Feeling bad or failure about yourself  0 -  Trouble concentrating 0 -  Moving slowly or fidgety/restless 0 -  Suicidal thoughts 0 -  PHQ-9 Score 12 -     Wt Readings from Last 500 Encounters:  05/21/16 250 lb (113.4 kg)  05/06/16 248 lb (112.5 kg)  04/18/16 249 lb (112.9 kg)  03/26/16 254 lb (115.2 kg)  02/08/16 249 lb (112.9 kg)     ALLERGIES: Allergies  Allergen Reactions  . Hydrocodone     MEDICATIONS: Current Outpatient Prescriptions on File Prior to Visit  Medication Sig Dispense Refill  . atorvastatin (LIPITOR) 20 MG tablet Take 1 tablet (20 mg total) by mouth daily. 90 tablet 3  . doxycycline (VIBRA-TABS) 100 MG tablet Take 1 tablet (100 mg total) by mouth 2 (two) times daily. 1 po bid  20 tablet 0  . levothyroxine (SYNTHROID, LEVOTHROID) 200 MCG tablet Take 1 tablet (200 mcg total) by mouth daily before breakfast. 90 tablet 3  . metFORMIN (GLUCOPHAGE) 500 MG tablet Take 1 tablet (500 mg total) by mouth daily with breakfast. 30 tablet 1  . montelukast (SINGULAIR) 10 MG tablet Take 1 tablet (10 mg total) by mouth at bedtime. 90 tablet 3  . norgestrel-ethinyl estradiol (LO/OVRAL,CRYSELLE) 0.3-30 MG-MCG tablet Take 1 tablet by mouth daily. 3 Package 3  . Vitamin D, Ergocalciferol, (DRISDOL) 50000 units CAPS capsule Take 1 capsule (50,000 Units total) by mouth every 7 (seven) days. 12 capsule 3   No current facility-administered medications on file prior to visit.     PAST MEDICAL HISTORY: Past Medical History:  Diagnosis Date  . Allergic rhinitis   . Dysmenorrhea   . Hyperlipidemia   . Hypothyroidism   . Joint pain   . Obesity   . Plantar fasciitis   . Tendonitis     PAST SURGICAL HISTORY: Past Surgical History:  Procedure Laterality Date  . CESAREAN SECTION  2008  . WISDOM TOOTH EXTRACTION     age 79    SOCIAL HISTORY: Social History  Substance Use Topics  . Smoking status: Never Smoker  . Smokeless tobacco: Never Used  . Alcohol use No    FAMILY HISTORY: Family History  Problem Relation Age of Onset  .  Arthritis Mother   . Hypertension Mother   . Anxiety disorder Mother   . Sleep apnea Mother   . Diabetes Father   . Alcohol abuse Father   . Depression Father   . Hyperlipidemia Father     ROS: Review of Systems  Constitutional: Negative for weight loss.  Psychiatric/Behavioral: Negative for suicidal ideas.    PHYSICAL EXAM: Blood pressure 108/69, pulse 67, temperature 98.2 F (36.8 C), temperature source Oral, height 5\' 3"  (1.6 m), weight 250 lb (113.4 kg), last menstrual period 04/22/2016, SpO2 98 %. Body mass index is 44.29 kg/m. Physical Exam  Constitutional: She is oriented to person, place, and time. She appears well-developed and  well-nourished.  Cardiovascular: Normal rate.   Pulmonary/Chest: Effort normal.  Musculoskeletal: Normal range of motion.  Neurological: She is oriented to person, place, and time.  Skin: Skin is warm and dry.  Psychiatric: She has a normal mood and affect. Her behavior is normal.  Vitals reviewed.   RECENT LABS AND TESTS: BMET    Component Value Date/Time   NA 141 03/26/2016 1136   K 4.2 03/26/2016 1136   CL 99 03/26/2016 1136   CO2 24 03/26/2016 1136   GLUCOSE 81 03/26/2016 1136   BUN 12 03/26/2016 1136   CREATININE 0.79 03/26/2016 1136   CALCIUM 9.2 03/26/2016 1136   GFRNONAA 90 03/26/2016 1136   GFRAA 104 03/26/2016 1136   Lab Results  Component Value Date   HGBA1C 5.8 (H) 03/26/2016   Lab Results  Component Value Date   INSULIN 23.4 03/26/2016   CBC    Component Value Date/Time   WBC 10.7 03/26/2016 1136   RBC 4.33 03/26/2016 1136   HCT 37.9 03/26/2016 1136   PLT 429 (H) 03/26/2016 1136   MCV 88 03/26/2016 1136   MCH 29.1 03/26/2016 1136   MCHC 33.2 03/26/2016 1136   RDW 14.3 03/26/2016 1136   LYMPHSABS 3.0 03/26/2016 1136   EOSABS 0.2 03/26/2016 1136   BASOSABS 0.0 03/26/2016 1136   Iron/TIBC/Ferritin/ %Sat No results found for: IRON, TIBC, FERRITIN, IRONPCTSAT Lipid Panel     Component Value Date/Time   CHOL 191 03/26/2016 1136   TRIG 160 (H) 03/26/2016 1136   HDL 49 03/26/2016 1136   CHOLHDL 5.4 (H) 02/08/2016 1026   LDLCALC 110 (H) 03/26/2016 1136   Hepatic Function Panel     Component Value Date/Time   PROT 7.2 03/26/2016 1136   ALBUMIN 4.5 03/26/2016 1136   AST 10 03/26/2016 1136   ALT 14 03/26/2016 1136   ALKPHOS 80 03/26/2016 1136   BILITOT 0.3 03/26/2016 1136      Component Value Date/Time   TSH 2.110 03/26/2016 1136   TSH 1.650 02/08/2016 1026    ASSESSMENT AND PLAN: Depression, unspecified depression type - Plan: buPROPion (WELLBUTRIN SR) 150 MG 12 hr tablet  Morbid obesity (HCC)  PLAN:  Depression with Emotional Eating  Behaviors We discussed behavior modification techniques today to help Chloe Byrd deal with her emotional eating and depression. She has agreed to start to take Wellbutrin SR 150 mg every morning #30 with no refills and will work on cognitive behavioral therapy to help decrease emotional eating and agreed to follow up as directed.  We spent > than 50% of the 30 minute visit on the counseling as documented in the note.   Obesity Jaleea is currently in the action stage of change. As such, her goal is to continue with weight loss efforts She has agreed to follow the Category 2  plan Chloe Byrd has been instructed to work up to a goal of 150 minutes of combined cardio and strengthening exercise per week for weight loss and overall health benefits. We discussed the following Behavioral Modification Stratagies today: increasing lean protein intake, decreasing simple carbohydrates , increasing vegetables, increasing lower sugar fruits, increasing fiber rich foods and emotional eating strategies  Chloe Byrd has agreed to follow up with our clinic in 2 weeks. She was informed of the importance of frequent follow up visits to maximize her success with intensive lifestyle modifications for her multiple health conditions.  I, [place] CraneJoanne Murray, am acting as scribe for Quillian Quincearen Sherron Mummert, MD  I have reviewed the above documentation for accuracy and completeness, and I agree with the above. -Quillian Quincearen Ashleymarie Granderson, MD

## 2016-05-22 MED FILL — BUPROPION SR 150 MG TABLET: 150 | 30 days supply | Qty: 30 | Fill #0

## 2016-06-05 ENCOUNTER — Ambulatory Visit (INDEPENDENT_AMBULATORY_CARE_PROVIDER_SITE_OTHER): Payer: 59 | Admitting: Family Medicine

## 2016-06-05 VITALS — BP 103/66 | HR 70 | Temp 98.5°F | Ht 63.0 in | Wt 247.0 lb

## 2016-06-05 DIAGNOSIS — Z9189 Other specified personal risk factors, not elsewhere classified: Secondary | ICD-10-CM | POA: Diagnosis not present

## 2016-06-05 DIAGNOSIS — F3289 Other specified depressive episodes: Secondary | ICD-10-CM | POA: Diagnosis not present

## 2016-06-05 DIAGNOSIS — R7303 Prediabetes: Secondary | ICD-10-CM | POA: Diagnosis not present

## 2016-06-05 NOTE — Progress Notes (Signed)
Office: [phone]  /  Fax: [phone]   HPI:   Chief Complaint: OBESITY Chloe Byrd is here to discuss her progress with her obesity treatment plan. She is following her eating plan approximately 75 % of the time and states she is exercising 15 minutes 3 times per week. Chloe Byrd continues to do well with weight loss on category 2 plan. She is doing better with emotional eating, planning ahead to make better food choices. She has increased walking after lunch. Her weight is 247 lb (112 kg) today and has had a weight loss of 3 pounds over a period of 2 weeks since her last visit. She has lost 7 lbs since starting treatment with [place].  Depression with emotional eating behaviors Chloe Byrd started Wellbutrin and noted decreased emotional eating, she feels more in control, mood is good. Blood pressure is stable. She denies insomnia. She has been working on behavior modification techniques to help reduce her emotional eating and has been somewhat successful. She shows no sign of suicidal or homicidal ideations.  Pre-Diabetes Chloe Byrd has a diagnosis of prediabetes based on her elevated HgA1c and was informed this puts her at greater risk of developing diabetes. She is stable on metformin and She denies nausea or hypoglycemia. She continues to work on diet and exercise to decrease risk of diabetes.   At risk for diabetes Chloe Byrd is at higher than average risk for developing diabetes due to her obesity. She currently denies polyuria or polydipsia.  Depression screen Chloe Byrd 2/9 03/26/2016 02/08/2016  Decreased Interest 1 0  Down, Depressed, Hopeless 3 0  PHQ - 2 Score 4 0  Altered sleeping 3 -  Tired, decreased energy 3 -  Change in appetite 2 -  Feeling bad or failure about yourself  0 -  Trouble concentrating 0 -  Moving slowly or fidgety/restless 0 -  Suicidal thoughts 0 -  PHQ-9 Score 12 -    Wt Readings from Last 500 Encounters:  06/05/16 247 lb (112 kg)  05/21/16 250 lb (113.4 kg)  05/06/16 248 lb  (112.5 kg)  04/18/16 249 lb (112.9 kg)  03/26/16 254 lb (115.2 kg)  02/08/16 249 lb (112.9 kg)     ALLERGIES: Allergies  Allergen Reactions  . Hydrocodone     MEDICATIONS: Current Outpatient Prescriptions on File Prior to Visit  Medication Sig Dispense Refill  . atorvastatin (LIPITOR) 20 MG tablet Take 1 tablet (20 mg total) by mouth daily. 90 tablet 3  . buPROPion (WELLBUTRIN SR) 150 MG 12 hr tablet Take 1 tablet (150 mg total) by mouth every morning. 30 tablet 0  . doxycycline (VIBRA-TABS) 100 MG tablet Take 1 tablet (100 mg total) by mouth 2 (two) times daily. 1 po bid 20 tablet 0  . levothyroxine (SYNTHROID, LEVOTHROID) 200 MCG tablet Take 1 tablet (200 mcg total) by mouth daily before breakfast. 90 tablet 3  . metFORMIN (GLUCOPHAGE) 500 MG tablet Take 1 tablet (500 mg total) by mouth daily with breakfast. 30 tablet 1  . montelukast (SINGULAIR) 10 MG tablet Take 1 tablet (10 mg total) by mouth at bedtime. 90 tablet 3  . norgestrel-ethinyl estradiol (LO/OVRAL,CRYSELLE) 0.3-30 MG-MCG tablet Take 1 tablet by mouth daily. 3 Package 3  . Vitamin D, Ergocalciferol, (DRISDOL) 50000 units CAPS capsule Take 1 capsule (50,000 Units total) by mouth every 7 (seven) days. 12 capsule 3   No current facility-administered medications on file prior to visit.     PAST MEDICAL HISTORY: Past Medical History:  Diagnosis Date  .  Allergic rhinitis   . Dysmenorrhea   . Hyperlipidemia   . Hypothyroidism   . Joint pain   . Obesity   . Plantar fasciitis   . Tendonitis     PAST SURGICAL HISTORY: Past Surgical History:  Procedure Laterality Date  . CESAREAN SECTION  2008  . WISDOM TOOTH EXTRACTION     age 53    SOCIAL HISTORY: Social History  Substance Use Topics  . Smoking status: Never Smoker  . Smokeless tobacco: Never Used  . Alcohol use No    FAMILY HISTORY: Family History  Problem Relation Age of Onset  . Arthritis Mother   . Hypertension Mother   . Anxiety disorder Mother    . Sleep apnea Mother   . Diabetes Father   . Alcohol abuse Father   . Depression Father   . Hyperlipidemia Father     ROS: Review of Systems  Constitutional: Positive for weight loss.  Gastrointestinal: Negative for nausea and vomiting.  Genitourinary: Negative for frequency.  Musculoskeletal:       Negative muscle weakness  Endo/Heme/Allergies: Negative for polydipsia.       Negative hypoglycemia  Psychiatric/Behavioral: Positive for suicidal ideas. The patient does not have insomnia.     PHYSICAL EXAM: Blood pressure 103/66, pulse 70, temperature 98.5 F (36.9 C), temperature source Oral, height 5\' 3"  (1.6 m), weight 247 lb (112 kg), last menstrual period 05/27/2016, SpO2 99 %. Body mass index is 43.75 kg/m. Physical Exam  Constitutional: She is oriented to person, place, and time. She appears well-developed and well-nourished.  Cardiovascular: Normal rate.   Pulmonary/Chest: Effort normal.  Musculoskeletal: Normal range of motion.  Neurological: She is oriented to person, place, and time.  Skin: Skin is warm and dry.  Psychiatric: She has a normal mood and affect. Her behavior is normal.  Vitals reviewed.   RECENT LABS AND TESTS: BMET    Component Value Date/Time   NA 141 03/26/2016 1136   K 4.2 03/26/2016 1136   CL 99 03/26/2016 1136   CO2 24 03/26/2016 1136   GLUCOSE 81 03/26/2016 1136   BUN 12 03/26/2016 1136   CREATININE 0.79 03/26/2016 1136   CALCIUM 9.2 03/26/2016 1136   GFRNONAA 90 03/26/2016 1136   GFRAA 104 03/26/2016 1136   Lab Results  Component Value Date   HGBA1C 5.8 (H) 03/26/2016   Lab Results  Component Value Date   INSULIN 23.4 03/26/2016   CBC    Component Value Date/Time   WBC 10.7 03/26/2016 1136   RBC 4.33 03/26/2016 1136   HCT 37.9 03/26/2016 1136   PLT 429 (H) 03/26/2016 1136   MCV 88 03/26/2016 1136   MCH 29.1 03/26/2016 1136   MCHC 33.2 03/26/2016 1136   RDW 14.3 03/26/2016 1136   LYMPHSABS 3.0 03/26/2016 1136    EOSABS 0.2 03/26/2016 1136   BASOSABS 0.0 03/26/2016 1136   Iron/TIBC/Ferritin/ %Sat No results found for: IRON, TIBC, FERRITIN, IRONPCTSAT Lipid Panel     Component Value Date/Time   CHOL 191 03/26/2016 1136   TRIG 160 (H) 03/26/2016 1136   HDL 49 03/26/2016 1136   CHOLHDL 5.4 (H) 02/08/2016 1026   LDLCALC 110 (H) 03/26/2016 1136   Hepatic Function Panel     Component Value Date/Time   PROT 7.2 03/26/2016 1136   ALBUMIN 4.5 03/26/2016 1136   AST 10 03/26/2016 1136   ALT 14 03/26/2016 1136   ALKPHOS 80 03/26/2016 1136   BILITOT 0.3 03/26/2016 1136  Component Value Date/Time   TSH 2.110 03/26/2016 1136   TSH 1.650 02/08/2016 1026    ASSESSMENT AND PLAN: Prediabetes - Plan: metFORMIN (GLUCOPHAGE) 500 MG tablet  Other depression  At risk for diabetes mellitus  Morbid obesity (HCC)  PLAN:  Depression with Emotional Eating Behaviors We discussed behavior modification techniques today to help Chloe Byrd deal with her emotional eating and depression. She has agreed to continue to take Wellbutrin SR 150 mg qd and cognitive behavioral therapy to help decrease emotional eating. She agreed to follow up as directed.  Pre-Diabetes Chloe Byrd will continue to work on weight loss, exercise, and decreasing simple carbohydrates in her diet to help decrease the risk of diabetes. We dicussed metformin including benefits and risks. She was informed that eating too many simple carbohydrates or too many calories at one sitting increases the likelihood of GI side effects. Chloe Byrd agrees to continue Metformin, we will refill for 1 month. Chloe Byrd agreed to follow up with us as directed to monitor her progress.  Diabetes risk counselling Chloe Byrd was given extended (at least 15 minutes) diabetes prevention counseling today. She is 46 y.o. female and has risk factors for diabetes including obesity. We discussed intensive lifestyle modifications today with an emphasis on weight loss as well as increasing  exercise and decreasing simple carbohydrates in her diet.  Obesity Chloe Byrd is currently in the action stage of change. As such, her goal is to continue with weight loss efforts She has agreed to follow the Category 2 plan Chloe Byrd has been instructed to work up to a goal of 150 minutes of combined cardio and strengthening exercise per week for weight loss and overall health benefits. We discussed the following Behavioral Modification Stratagies today: increasing lean protein intake, increasing lower sugar fruits, work on meal planning and easy cooking plans and dealing with family or coworker sabotage  Chloe Byrd has agreed to follow up with our clinic in 2 weeks. She was informed of the importance of frequent follow up visits to maximize her success with intensive lifestyle modifications for her multiple health conditions.  I, [place] CraneJoanne Murray, am acting as scribe for Quillian Quincearen Jasmeet Gehl, MD  I have reviewed the above documentation for accuracy and completeness, and I agree with the above. -Quillian Quincearen Mkenzie Dotts, MD

## 2016-06-06 MED FILL — ATORVASTATIN 20 MG TABLET: 20 | 90 days supply | Qty: 90 | Fill #1

## 2016-06-06 MED FILL — LEVOTHYROXINE 200 MCG TAB: 200 | 90 days supply | Qty: 90 | Fill #1

## 2016-06-06 MED FILL — MONTELUKAST SOD 10 MG TAB: 10 | 90 days supply | Qty: 90 | Fill #1

## 2016-06-10 MED ORDER — METFORMIN HCL 500 MG PO TABS: 500.0000 mg | ORAL_TABLET | Freq: Every day | ORAL | 1 refills | Status: DC

## 2016-06-18 ENCOUNTER — Telehealth: Payer: Self-pay | Admitting: *Deleted

## 2016-06-18 MED ORDER — DOXYCYCLINE HYCLATE 100 MG PO TABS: 100.0000 mg | ORAL_TABLET | Freq: Every day | ORAL | 0 refills | Status: DC

## 2016-06-18 MED ORDER — DOXYCYCLINE HYCLATE 100 MG PO TABS: 100.0000 mg | ORAL_TABLET | Freq: Every day | ORAL | 1 refills | Status: DC

## 2016-06-18 NOTE — Telephone Encounter (Signed)
Prescription sent to pharmacy.

## 2016-06-18 NOTE — Telephone Encounter (Signed)
Patients acne is not staying gone and she would like rx for doxycycline 100mg  daily sent to the drug store and then [facility] day [facility].

## 2016-06-20 ENCOUNTER — Other Ambulatory Visit (INDEPENDENT_AMBULATORY_CARE_PROVIDER_SITE_OTHER): Payer: Self-pay | Admitting: Family Medicine

## 2016-06-20 DIAGNOSIS — F329 Major depressive disorder, single episode, unspecified: Secondary | ICD-10-CM

## 2016-06-20 DIAGNOSIS — F32A Depression, unspecified: Secondary | ICD-10-CM

## 2016-06-20 MED FILL — metFORMIN HCL 500 MG TABS: 500 | 30 days supply | Qty: 30 | Fill #0

## 2016-07-02 ENCOUNTER — Ambulatory Visit (INDEPENDENT_AMBULATORY_CARE_PROVIDER_SITE_OTHER): Payer: 59 | Admitting: Family Medicine

## 2016-07-02 VITALS — BP 126/84 | HR 78 | Temp 98.5°F | Ht 63.0 in | Wt 251.0 lb

## 2016-07-02 DIAGNOSIS — F3289 Other specified depressive episodes: Secondary | ICD-10-CM | POA: Diagnosis not present

## 2016-07-02 DIAGNOSIS — F32A Depression, unspecified: Secondary | ICD-10-CM

## 2016-07-02 DIAGNOSIS — F329 Major depressive disorder, single episode, unspecified: Secondary | ICD-10-CM | POA: Diagnosis not present

## 2016-07-02 DIAGNOSIS — R7303 Prediabetes: Secondary | ICD-10-CM

## 2016-07-02 DIAGNOSIS — Z9189 Other specified personal risk factors, not elsewhere classified: Secondary | ICD-10-CM | POA: Diagnosis not present

## 2016-07-02 MED ORDER — BUPROPION HCL ER (SR) 150 MG PO TB12: 150.0000 mg | ORAL_TABLET | Freq: Every morning | ORAL | 0 refills | Status: DC

## 2016-07-02 MED ORDER — METFORMIN HCL 500 MG PO TABS: 500.0000 mg | ORAL_TABLET | Freq: Every day | ORAL | 1 refills | Status: DC

## 2016-07-02 NOTE — Progress Notes (Signed)
Office: [phone]  /  Fax: [phone]   HPI:   Chief Complaint: OBESITY Chloe Byrd is here to discuss her progress with her obesity treatment plan. She is following her eating plan approximately 25 % of the time and states she is exercising 30 minutes 3 times per week. Alpha has struggled with planning meals ahead of time and notes increased family sabotage. She also notes some increased stress eating. She would like to try something completely different. Her weight is 251 lb (113.9 kg) today and had had a weight gain of 4 lbs over a period of 4 weeks since her last visit. She has lost 3 lbs since starting treatment with [place].  Pre-Diabetes Chloe Byrd has a diagnosis of prediabetes based on her elevated Hgb A1c and was informed this puts her at greater risk of developing diabetes. She is taking metformin currently and continues to work on diet and exercise to decrease risk of diabetes. She denies nausea or hypoglycemia.  At risk for diabetes Chloe Byrd is at higher than average risk for developing diabetes due to her obesity and pre-diabetes. She currently denies polyuria or polydipsia.  Depression with emotional eating behaviors Chloe Byrd is struggling with emotional eating and using food for comfort to the extent that it is negatively impacting her health. She often snacks when she is not hungry. Chloe Byrd sometimes feels she is out of control and then feels guilty that she made poor food choices. She has been working on behavior modification techniques to help reduce her emotional eating and has been somewhat successful. She is currently stable of Wellbutrin. She shows no sign of suicidal or homicidal ideations.  Depression screen [name] 2/9 03/26/2016 02/08/2016  Decreased Interest 1 0  Down, Depressed, Hopeless 3 0  PHQ - 2 Score 4 0  Altered sleeping 3 -  Tired, decreased energy 3 -  Change in appetite 2 -  Feeling bad or failure about yourself  0 -  Trouble concentrating 0 -  Moving slowly or  fidgety/restless 0 -  Suicidal thoughts 0 -  PHQ-9 Score 12 -     Wt Readings from Last 500 Encounters:  07/02/16 251 lb (113.9 kg)  06/05/16 247 lb (112 kg)  05/21/16 250 lb (113.4 kg)  05/06/16 248 lb (112.5 kg)  04/18/16 249 lb (112.9 kg)  03/26/16 254 lb (115.2 kg)  02/08/16 249 lb (112.9 kg)     ALLERGIES: Allergies  Allergen Reactions  . Hydrocodone     MEDICATIONS: Current Outpatient Prescriptions on File Prior to Visit  Medication Sig Dispense Refill  . atorvastatin (LIPITOR) 20 MG tablet Take 1 tablet (20 mg total) by mouth daily. 90 tablet 3  . buPROPion (WELLBUTRIN SR) 150 MG 12 hr tablet Take 1 tablet (150 mg total) by mouth every morning. 30 tablet 0  . doxycycline (VIBRA-TABS) 100 MG tablet Take 1 tablet (100 mg total) by mouth daily. 1 po bid 30 tablet 0  . doxycycline (VIBRA-TABS) 100 MG tablet Take 1 tablet (100 mg total) by mouth daily. 90 tablet 1  . levothyroxine (SYNTHROID, LEVOTHROID) 200 MCG tablet Take 1 tablet (200 mcg total) by mouth daily before breakfast. 90 tablet 3  . metFORMIN (GLUCOPHAGE) 500 MG tablet Take 1 tablet (500 mg total) by mouth daily with breakfast. 30 tablet 1  . montelukast (SINGULAIR) 10 MG tablet Take 1 tablet (10 mg total) by mouth at bedtime. 90 tablet 3  . norgestrel-ethinyl estradiol (LO/OVRAL,CRYSELLE) 0.3-30 MG-MCG tablet Take 1 tablet by mouth daily. 3 Package 3  .  Vitamin D, Ergocalciferol, (DRISDOL) 50000 units CAPS capsule Take 1 capsule (50,000 Units total) by mouth every 7 (seven) days. 12 capsule 3   No current facility-administered medications on file prior to visit.     PAST MEDICAL HISTORY: Past Medical History:  Diagnosis Date  . Allergic rhinitis   . Dysmenorrhea   . Hyperlipidemia   . Hypothyroidism   . Joint pain   . Obesity   . Plantar fasciitis   . Tendonitis     PAST SURGICAL HISTORY: Past Surgical History:  Procedure Laterality Date  . CESAREAN SECTION  2008  . WISDOM TOOTH EXTRACTION      age 46    SOCIAL HISTORY: Social History  Substance Use Topics  . Smoking status: Never Smoker  . Smokeless tobacco: Never Used  . Alcohol use No    FAMILY HISTORY: Family History  Problem Relation Age of Onset  . Arthritis Mother   . Hypertension Mother   . Anxiety disorder Mother   . Sleep apnea Mother   . Diabetes Father   . Alcohol abuse Father   . Depression Father   . Hyperlipidemia Father     ROS: Review of Systems  Constitutional: Negative for weight loss.  Gastrointestinal: Negative for nausea.  Genitourinary: Negative for frequency.  Endo/Heme/Allergies: Negative for polydipsia.       Negative hypoglycemia  Psychiatric/Behavioral: Positive for depression. Negative for suicidal ideas.    PHYSICAL EXAM: Blood pressure 126/84, pulse 78, temperature 98.5 F (36.9 C), temperature source Oral, height  (1.6 m), weight 251 lb (113.9 kg), SpO2 97 %. Body mass index is 44.46 kg/m. Physical Exam  Constitutional: She is oriented to person, place, and time. She appears well-developed and well-nourished.  Cardiovascular: Normal rate.   Pulmonary/Chest: Effort normal.  Musculoskeletal: Normal range of motion.  Neurological: She is oriented to person, place, and time.  Skin: Skin is warm and dry.  Vitals reviewed.   RECENT LABS AND TESTS: BMET    Component Value Date/Time   NA 141 03/26/2016 1136   K 4.2 03/26/2016 1136   CL 99 03/26/2016 1136   CO2 24 03/26/2016 1136   GLUCOSE 81 03/26/2016 1136   BUN 12 03/26/2016 1136   CREATININE 0.79 03/26/2016 1136   CALCIUM 9.2 03/26/2016 1136   GFRNONAA 90 03/26/2016 1136   GFRAA 104 03/26/2016 1136   Lab Results  Component Value Date   HGBA1C 5.8 (H) 03/26/2016   Lab Results  Component Value Date   INSULIN 23.4 03/26/2016   CBC    Component Value Date/Time   WBC 10.7 03/26/2016 1136   RBC 4.33 03/26/2016 1136   HCT 37.9 03/26/2016 1136   PLT 429 (H) 03/26/2016 1136   MCV 88 03/26/2016 1136   MCH  29.1 03/26/2016 1136   MCHC 33.2 03/26/2016 1136   RDW 14.3 03/26/2016 1136   LYMPHSABS 3.0 03/26/2016 1136   EOSABS 0.2 03/26/2016 1136   BASOSABS 0.0 03/26/2016 1136   Iron/TIBC/Ferritin/ %Sat No results found for: IRON, TIBC, FERRITIN, IRONPCTSAT Lipid Panel     Component Value Date/Time   CHOL 191 03/26/2016 1136   TRIG 160 (H) 03/26/2016 1136   HDL 49 03/26/2016 1136   CHOLHDL 5.4 (H) 02/08/2016 1026   LDLCALC 110 (H) 03/26/2016 1136   Hepatic Function Panel     Component Value Date/Time   PROT 7.2 03/26/2016 1136   ALBUMIN 4.5 03/26/2016 1136   AST 10 03/26/2016 1136   ALT 14 03/26/2016 1136  ALKPHOS 80 03/26/2016 1136   BILITOT 0.3 03/26/2016 1136      Component Value Date/Time   TSH 2.110 03/26/2016 1136   TSH 1.650 02/08/2016 1026    ASSESSMENT AND PLAN: Prediabetes - Plan: metFORMIN (GLUCOPHAGE) 500 MG tablet  Other depression  At risk for diabetes mellitus  Morbid obesity (HCC)  Depression, unspecified depression type - Plan: buPROPion (WELLBUTRIN SR) 150 MG 12 hr tablet  PLAN:  Pre-Diabetes Simya will continue to work on weight loss, exercise, and decreasing simple carbohydrates in her diet to help decrease the risk of diabetes. We dicussed metformin including benefits and risks. She was informed that eating too many simple carbohydrates or too many calories at one sitting increases the likelihood of GI side effects. Kolbee agrees to continue to take metformin for now and a prescription was written today for 1 month refill. Mikhia agreed to follow up with [place] as directed to monitor her progress.  Diabetes risk counselling Gurpreet was given extended (at least 15 minutes) diabetes prevention counseling today. She is 46 y.o. female and has risk factors for diabetes including obesity and pre-diabetes. We discussed intensive lifestyle modifications today with an emphasis on weight loss as well as increasing exercise and decreasing simple carbohydrates in her  diet.  Depression with Emotional Eating Behaviors We discussed behavior modification techniques today to help Debroh deal with her emotional eating and depression. She has agreed to continue to take Wellbutrin SR 150 mg qd, we will refill for 1 month and agreed to follow up as directed.  Obesity Jimma is currently in the action stage of change. As such, her goal is to continue with weight loss efforts She has agreed to change to follow a lower carbohydrate, vegetable and lean protein rich diet plan Diavian has been instructed to work up to a goal of 150 minutes of combined cardio and strengthening exercise per week for weight loss and overall health benefits. We discussed the following Behavioral Modification Stratagies today: increasing lean protein intake, decreasing simple carbohydrates  and work on meal planning and easy cooking plans  Keon has agreed to follow up with our clinic in 2 weeks. She was informed of the importance of frequent follow up visits to maximize her success with intensive lifestyle modifications for her multiple health conditions.  I, [place] Crane, am acting as scribe for Quillian Quince, MD  I have reviewed the above documentation for accuracy and completeness, and I agree with the above. -Quillian Quince, MD

## 2016-07-03 MED FILL — BUPROPION SR 150 MG TABLET: 150 | 30 days supply | Qty: 30 | Fill #0

## 2016-07-16 ENCOUNTER — Ambulatory Visit (INDEPENDENT_AMBULATORY_CARE_PROVIDER_SITE_OTHER): Payer: 59 | Admitting: Family Medicine

## 2016-07-16 VITALS — BP 126/74 | HR 69 | Temp 98.5°F | Ht 63.0 in | Wt 253.0 lb

## 2016-07-16 DIAGNOSIS — F3289 Other specified depressive episodes: Secondary | ICD-10-CM | POA: Diagnosis not present

## 2016-07-16 DIAGNOSIS — Z9189 Other specified personal risk factors, not elsewhere classified: Secondary | ICD-10-CM

## 2016-07-16 DIAGNOSIS — F329 Major depressive disorder, single episode, unspecified: Secondary | ICD-10-CM

## 2016-07-16 DIAGNOSIS — F32A Depression, unspecified: Secondary | ICD-10-CM

## 2016-07-16 NOTE — Progress Notes (Signed)
Office: [phone]  /  Fax: [phone]   HPI:   Chief Complaint: OBESITY Chloe Byrd is here to discuss her progress with her obesity treatment plan. She is following her eating plan approximately 30 % of the time and states she is exercising 2 times per week. Chloe Byrd was changed to low carbohydrate plan and was able to follow for 5 days then got off track. She got discouraged and went completely off track. She is ready to get back on track now. Her weight is 253 lb (114.8 kg) today and has had a weight gain of 2 lbs over a period of 2 weeks since her last visit. She has lost 1 lb since starting treatment with [place].  Depression with emotional eating behaviors Chloe Byrd is doing well on Wellbutrin, mood is stable. She is still struggling with emotional eating but not as bad. She is using food for comfort to the extent that it is negatively impacting her health. She often snacks when she is not hungry. Chloe Byrd sometimes feels she is out of control and then feels guilty that she made poor food choices. She has been working on behavior modification techniques to help reduce her emotional eating and has been somewhat successful. She has no insomnia and blood pressure is stable. She shows no sign of suicidal or homicidal ideations.  At risk for diabetes Chloe Byrd is at higher than average risk for developing diabetes due to her obesity. She currently denies polyuria or polydipsia.  Depression screen [facility] 2/9 03/26/2016 02/08/2016  Decreased Interest 1 0  Down, Depressed, Hopeless 3 0  PHQ - 2 Score 4 0  Altered sleeping 3 -  Tired, decreased energy 3 -  Change in appetite 2 -  Feeling bad or failure about yourself  0 -  Trouble concentrating 0 -  Moving slowly or fidgety/restless 0 -  Suicidal thoughts 0 -  PHQ-9 Score 12 -     Wt Readings from Last 500 Encounters:  07/16/16 253 lb (114.8 kg)  07/02/16 251 lb (113.9 kg)  06/05/16 247 lb (112 kg)  05/21/16 250 lb (113.4 kg)  05/06/16 248 lb (112.5 kg)    04/18/16 249 lb (112.9 kg)  03/26/16 254 lb (115.2 kg)  02/08/16 249 lb (112.9 kg)     ALLERGIES: Allergies  Allergen Reactions  . Hydrocodone     MEDICATIONS: Current Outpatient Prescriptions on File Prior to Visit  Medication Sig Dispense Refill  . atorvastatin (LIPITOR) 20 MG tablet Take 1 tablet (20 mg total) by mouth daily. 90 tablet 3  . buPROPion (WELLBUTRIN SR) 150 MG 12 hr tablet Take 1 tablet (150 mg total) by mouth every morning. 30 tablet 0  . doxycycline (VIBRA-TABS) 100 MG tablet Take 1 tablet (100 mg total) by mouth daily. 1 po bid 30 tablet 0  . doxycycline (VIBRA-TABS) 100 MG tablet Take 1 tablet (100 mg total) by mouth daily. 90 tablet 1  . levothyroxine (SYNTHROID, LEVOTHROID) 200 MCG tablet Take 1 tablet (200 mcg total) by mouth daily before breakfast. 90 tablet 3  . metFORMIN (GLUCOPHAGE) 500 MG tablet Take 1 tablet (500 mg total) by mouth daily with breakfast. 30 tablet 1  . montelukast (SINGULAIR) 10 MG tablet Take 1 tablet (10 mg total) by mouth at bedtime. 90 tablet 3  . norgestrel-ethinyl estradiol (LO/OVRAL,CRYSELLE) 0.3-30 MG-MCG tablet Take 1 tablet by mouth daily. 3 Package 3  . Vitamin D, Ergocalciferol, (DRISDOL) 50000 units CAPS capsule Take 1 capsule (50,000 Units total) by mouth every 7 (seven)  days. 12 capsule 3   No current facility-administered medications on file prior to visit.     PAST MEDICAL HISTORY: Past Medical History:  Diagnosis Date  . Allergic rhinitis   . Dysmenorrhea   . Hyperlipidemia   . Hypothyroidism   . Joint pain   . Obesity   . Plantar fasciitis   . Tendonitis     PAST SURGICAL HISTORY: Past Surgical History:  Procedure Laterality Date  . CESAREAN SECTION  2008  . WISDOM TOOTH EXTRACTION     age 62    SOCIAL HISTORY: Social History  Substance Use Topics  . Smoking status: Never Smoker  . Smokeless tobacco: Never Used  . Alcohol use No    FAMILY HISTORY: Family History  Problem Relation Age of Onset   . Arthritis Mother   . Hypertension Mother   . Anxiety disorder Mother   . Sleep apnea Mother   . Diabetes Father   . Alcohol abuse Father   . Depression Father   . Hyperlipidemia Father     ROS: Review of Systems  Constitutional: Negative for malaise/fatigue.  Genitourinary: Negative for frequency.  Endo/Heme/Allergies: Negative for polydipsia.  Psychiatric/Behavioral: Positive for depression. Negative for suicidal ideas. The patient does not have insomnia.     PHYSICAL EXAM: Blood pressure 126/74, pulse 69, temperature 98.5 F (36.9 C), temperature source Oral, height  (1.6 m), weight 253 lb (114.8 kg), SpO2 99 %. Body mass index is 44.82 kg/m. Physical Exam  Constitutional: She is oriented to person, place, and time. She appears well-developed and well-nourished.  Cardiovascular: Normal rate.   Pulmonary/Chest: Effort normal.  Musculoskeletal: Normal range of motion.  Neurological: She is oriented to person, place, and time.  Skin: Skin is warm and dry.  Psychiatric: She has a normal mood and affect. Her behavior is normal.  Vitals reviewed.   RECENT LABS AND TESTS: BMET    Component Value Date/Time   NA 141 03/26/2016 1136   K 4.2 03/26/2016 1136   CL 99 03/26/2016 1136   CO2 24 03/26/2016 1136   GLUCOSE 81 03/26/2016 1136   BUN 12 03/26/2016 1136   CREATININE 0.79 03/26/2016 1136   CALCIUM 9.2 03/26/2016 1136   GFRNONAA 90 03/26/2016 1136   GFRAA 104 03/26/2016 1136   Lab Results  Component Value Date   HGBA1C 5.8 (H) 03/26/2016   Lab Results  Component Value Date   INSULIN 23.4 03/26/2016   CBC    Component Value Date/Time   WBC 10.7 03/26/2016 1136   RBC 4.33 03/26/2016 1136   HCT 37.9 03/26/2016 1136   PLT 429 (H) 03/26/2016 1136   MCV 88 03/26/2016 1136   MCH 29.1 03/26/2016 1136   MCHC 33.2 03/26/2016 1136   RDW 14.3 03/26/2016 1136   LYMPHSABS 3.0 03/26/2016 1136   EOSABS 0.2 03/26/2016 1136   BASOSABS 0.0 03/26/2016 1136    Iron/TIBC/Ferritin/ %Sat No results found for: IRON, TIBC, FERRITIN, IRONPCTSAT Lipid Panel     Component Value Date/Time   CHOL 191 03/26/2016 1136   TRIG 160 (H) 03/26/2016 1136   HDL 49 03/26/2016 1136   CHOLHDL 5.4 (H) 02/08/2016 1026   LDLCALC 110 (H) 03/26/2016 1136   Hepatic Function Panel     Component Value Date/Time   PROT 7.2 03/26/2016 1136   ALBUMIN 4.5 03/26/2016 1136   AST 10 03/26/2016 1136   ALT 14 03/26/2016 1136   ALKPHOS 80 03/26/2016 1136   BILITOT 0.3 03/26/2016 1136  Component Value Date/Time   TSH 2.110 03/26/2016 1136   TSH 1.650 02/08/2016 1026    ASSESSMENT AND PLAN: Other depression  Depression, unspecified depression type - Plan: buPROPion (WELLBUTRIN SR) 150 MG 12 hr tablet  At risk for diabetes mellitus  Morbid obesity (HCC)  PLAN:  Depression with Emotional Eating Behaviors We discussed behavior modification techniques today to help Chloe Byrd deal with her emotional eating and depression. She has agreed to continue to take Wellbutrin SR 150 mg qd, we will refill for 1 month and she agreed to follow up as directed.  Diabetes risk counselling Chloe Byrd was given extended (at least 15 minutes) diabetes prevention counseling today. She is 46 y.o. female and has risk factors for diabetes including obesity. We discussed intensive lifestyle modifications today with an emphasis on weight loss as well as increasing exercise and decreasing simple carbohydrates in her diet.  Obesity Chloe Byrd is currently in the action stage of change. As such, her goal is to continue with weight loss efforts She has agreed to change to follow our protein rich vegetarian plan Chloe Byrd has been instructed to work up to a goal of 150 minutes of combined cardio and strengthening exercise per week for weight loss and overall health benefits. We discussed the following Behavioral Modification Stratagies today: increasing lean protein intake, increasing fiber rich foods and  emotional eating strategies  Chloe Byrd has agreed to follow up with our clinic in 2 weeks. She was informed of the importance of frequent follow up visits to maximize her success with intensive lifestyle modifications for her multiple health conditions.  I, [place] Crane, am acting as scribe for Quillian Quince, MD  I have reviewed the above documentation for accuracy and completeness, and I agree with the above. -Quillian Quince, MD

## 2016-07-17 ENCOUNTER — Encounter (INDEPENDENT_AMBULATORY_CARE_PROVIDER_SITE_OTHER): Payer: Self-pay | Admitting: Family Medicine

## 2016-07-17 MED ORDER — BUPROPION HCL ER (SR) 150 MG PO TB12: 150.0000 mg | ORAL_TABLET | Freq: Every morning | ORAL | 0 refills | Status: DC

## 2016-07-22 MED FILL — metFORMIN HCL 500 MG TABS: 500 | 30 days supply | Qty: 30 | Fill #1

## 2016-07-22 MED FILL — DOXYCYCLINE HYC 100 MG TAB: 100 | 90 days supply | Qty: 90 | Fill #0

## 2016-07-29 MED FILL — BUPROPION SR 150 MG TABLET: 150 | 30 days supply | Qty: 30 | Fill #0

## 2016-08-01 ENCOUNTER — Ambulatory Visit (INDEPENDENT_AMBULATORY_CARE_PROVIDER_SITE_OTHER): Payer: 59 | Admitting: Family Medicine

## 2016-08-01 VITALS — BP 118/77 | HR 76 | Temp 98.2°F | Ht 63.0 in | Wt 255.0 lb

## 2016-08-01 DIAGNOSIS — Z9189 Other specified personal risk factors, not elsewhere classified: Secondary | ICD-10-CM

## 2016-08-01 DIAGNOSIS — R7303 Prediabetes: Secondary | ICD-10-CM | POA: Diagnosis not present

## 2016-08-01 MED ORDER — LIRAGLUTIDE -WEIGHT MANAGEMENT 18 MG/3ML ~~LOC~~ SOPN: 3.0000 mg | PEN_INJECTOR | Freq: Every day | SUBCUTANEOUS | 0 refills | Status: AC

## 2016-08-01 NOTE — Progress Notes (Signed)
Office: [phone]  /  Fax: [phone]   HPI:   Chief Complaint: OBESITY Chloe Byrd is here to discuss her progress with her obesity treatment plan. She is on the  follow a lower carbohydrate, vegetable and lean protein rich diet plan and is following her eating plan approximately 50 % of the time. She states she is exercising 0 minutes 0 times per week. Chloe Byrd is struggling to follow the plan and bot able to lose weight. She has tried phentermine in the past but no long term results. She would like to discus other medication options. Her weight is 255 lb (115.7 kg) today and has had a weight gain of 2 pounds over a period of 2 weeks since her last visit. She has gained 1 lb since starting treatment with us.  Pre-Diabetes Chloe Byrd has a diagnosis of prediabetes based on her elevated HgA1c and was informed this puts her at greater risk of developing diabetes. She is taking metformin currently and she still notes polyphagia and struggling to follow her diet prescription.  She continues to work on diet and exercise to decrease risk of diabetes. She denies nausea or hypoglycemia.  At risk for diabetes Chloe Byrd is at higher than average risk for developing diabetes due to her obesity and pre-diabetes. She currently denies polyuria or polydipsia.  Wt Readings from Last 500 Encounters:  08/01/16 255 lb (115.7 kg)  07/16/16 253 lb (114.8 kg)  07/02/16 251 lb (113.9 kg)  06/05/16 247 lb (112 kg)  05/21/16 250 lb (113.4 kg)  05/06/16 248 lb (112.5 kg)  04/18/16 249 lb (112.9 kg)  03/26/16 254 lb (115.2 kg)  02/08/16 249 lb (112.9 kg)     ALLERGIES: Allergies  Allergen Reactions  . Hydrocodone     MEDICATIONS: Current Outpatient Prescriptions on File Prior to Visit  Medication Sig Dispense Refill  . atorvastatin (LIPITOR) 20 MG tablet Take 1 tablet (20 mg total) by mouth daily. 90 tablet 3  . buPROPion (WELLBUTRIN SR) 150 MG 12 hr tablet Take 1 tablet (150 mg total) by mouth every morning. 30  tablet 0  . doxycycline (VIBRA-TABS) 100 MG tablet Take 1 tablet (100 mg total) by mouth daily. 1 po bid 30 tablet 0  . doxycycline (VIBRA-TABS) 100 MG tablet Take 1 tablet (100 mg total) by mouth daily. 90 tablet 1  . levothyroxine (SYNTHROID, LEVOTHROID) 200 MCG tablet Take 1 tablet (200 mcg total) by mouth daily before breakfast. 90 tablet 3  . montelukast (SINGULAIR) 10 MG tablet Take 1 tablet (10 mg total) by mouth at bedtime. 90 tablet 3  . norgestrel-ethinyl estradiol (LO/OVRAL,CRYSELLE) 0.3-30 MG-MCG tablet Take 1 tablet by mouth daily. 3 Package 3  . Vitamin D, Ergocalciferol, (DRISDOL) 50000 units CAPS capsule Take 1 capsule (50,000 Units total) by mouth every 7 (seven) days. 12 capsule 3   No current facility-administered medications on file prior to visit.     PAST MEDICAL HISTORY: Past Medical History:  Diagnosis Date  . Allergic rhinitis   . Dysmenorrhea   . Hyperlipidemia   . Hypothyroidism   . Joint pain   . Obesity   . Plantar fasciitis   . Tendonitis     PAST SURGICAL HISTORY: Past Surgical History:  Procedure Laterality Date  . CESAREAN SECTION  2008  . WISDOM TOOTH EXTRACTION     age 46    SOCIAL HISTORY: Social History  Substance Use Topics  . Smoking status: Never Smoker  . Smokeless tobacco: Never Used  . Alcohol use  No    FAMILY HISTORY: Family History  Problem Relation Age of Onset  . Arthritis Mother   . Hypertension Mother   . Anxiety disorder Mother   . Sleep apnea Mother   . Diabetes Father   . Alcohol abuse Father   . Depression Father   . Hyperlipidemia Father     ROS: Review of Systems  Constitutional: Negative for weight loss.  Gastrointestinal: Negative for nausea.  Genitourinary: Negative for frequency.  Endo/Heme/Allergies: Negative for polydipsia.       Polyphagia Negative hypoglycemia    PHYSICAL EXAM: Blood pressure 118/77, pulse 76, temperature 98.2 F (36.8 C), temperature source Oral, height 5\' 3"  (1.6 m),  weight 255 lb (115.7 kg), SpO2 97 %. Body mass index is 45.17 kg/m. Physical Exam  Constitutional: She is oriented to person, place, and time. She appears well-developed and well-nourished.  Cardiovascular: Normal rate.   Pulmonary/Chest: Effort normal.  Musculoskeletal: Normal range of motion.  Neurological: She is oriented to person, place, and time.  Skin: Skin is warm and dry.  Psychiatric: She has a normal mood and affect. Her behavior is normal.  Vitals reviewed.   RECENT LABS AND TESTS: BMET    Component Value Date/Time   NA 141 03/26/2016 1136   K 4.2 03/26/2016 1136   CL 99 03/26/2016 1136   CO2 24 03/26/2016 1136   GLUCOSE 81 03/26/2016 1136   BUN 12 03/26/2016 1136   CREATININE 0.79 03/26/2016 1136   CALCIUM 9.2 03/26/2016 1136   GFRNONAA 90 03/26/2016 1136   GFRAA 104 03/26/2016 1136   Lab Results  Component Value Date   HGBA1C 5.8 (H) 03/26/2016   Lab Results  Component Value Date   INSULIN 23.4 03/26/2016   CBC    Component Value Date/Time   WBC 10.7 03/26/2016 1136   RBC 4.33 03/26/2016 1136   HCT 37.9 03/26/2016 1136   PLT 429 (H) 03/26/2016 1136   MCV 88 03/26/2016 1136   MCH 29.1 03/26/2016 1136   MCHC 33.2 03/26/2016 1136   RDW 14.3 03/26/2016 1136   LYMPHSABS 3.0 03/26/2016 1136   EOSABS 0.2 03/26/2016 1136   BASOSABS 0.0 03/26/2016 1136   Iron/TIBC/Ferritin/ %Sat No results found for: IRON, TIBC, FERRITIN, IRONPCTSAT Lipid Panel     Component Value Date/Time   CHOL 191 03/26/2016 1136   TRIG 160 (H) 03/26/2016 1136   HDL 49 03/26/2016 1136   CHOLHDL 5.4 (H) 02/08/2016 1026   LDLCALC 110 (H) 03/26/2016 1136   Hepatic Function Panel     Component Value Date/Time   PROT 7.2 03/26/2016 1136   ALBUMIN 4.5 03/26/2016 1136   AST 10 03/26/2016 1136   ALT 14 03/26/2016 1136   ALKPHOS 80 03/26/2016 1136   BILITOT 0.3 03/26/2016 1136      Component Value Date/Time   TSH 2.110 03/26/2016 1136   TSH 1.650 02/08/2016 1026     ASSESSMENT AND PLAN: Prediabetes  At risk for diabetes mellitus  Morbid obesity (HCC) - Plan: Liraglutide -Weight Management (SAXENDA) 18 MG/3ML SOPN  PLAN:  Pre-Diabetes Chloe Byrd will continue to work on weight loss, exercise, and decreasing simple carbohydrates in her diet to help decrease the risk of diabetes. We dicussed metformin including benefits and risks. She was informed that eating too many simple carbohydrates or too many calories at one sitting increases the likelihood of GI side effects. Chloe Byrd agrees to discontinue Metformin and start to take Chloe Byrd 0.6 mg qd for now and a prescription was written today.  Chloe Byrd agreed to follow up with [place] as directed to monitor her progress.  Diabetes risk counselling Chloe Byrd was given extended (at least 15 minutes) diabetes prevention counseling today. She is 46 y.o. female and has risk factors for diabetes including obesity and pre-diabetes. We discussed intensive lifestyle modifications today with an emphasis on weight loss as well as increasing exercise and decreasing simple carbohydrates in her diet.  Obesity Chloe Byrd is currently in the action stage of change. As such, her goal is to continue with weight loss efforts She has agreed to keep a food journal with 1200 to 1500 calories and 80 grams of protein daily Chloe Byrd has been instructed to work up to a goal of 150 minutes of combined cardio and strengthening exercise per week for weight loss and overall health benefits. We discussed the following Behavioral Modification Strategies today: increasing lean protein intake, decreasing simple carbohydrates  and increasing vegetables   We discussed various medication options to help Chloe Byrd with her weight loss efforts and we both agreed to start Saxenda 6.0 mg qd for now and will follow up at agreed upon time.  Chloe Byrd has agreed to follow up with our clinic in 2 weeks. She was informed of the importance of frequent follow up visits to maximize her  success with intensive lifestyle modifications for her multiple health conditions.  I, [place] Crane, am acting as scribe for Quillian Quince, MD  I have reviewed the above documentation for accuracy and completeness, and I agree with the above. -Quillian Quince, MD

## 2016-08-05 MED FILL — SAXENDA 18 MG/3 ML PEN: 18 | 30 days supply | Qty: 15 | Fill #0

## 2016-08-07 MED FILL — UNIFINE PENTIPS 8MM 31G: 31G X 8 MM | 90 days supply | Qty: 100 | Fill #0

## 2016-08-15 ENCOUNTER — Ambulatory Visit (INDEPENDENT_AMBULATORY_CARE_PROVIDER_SITE_OTHER): Payer: 59 | Admitting: Family Medicine

## 2016-08-15 VITALS — BP 142/84 | HR 75 | Temp 98.4°F | Ht 63.0 in | Wt 245.0 lb

## 2016-08-15 DIAGNOSIS — R7303 Prediabetes: Secondary | ICD-10-CM | POA: Diagnosis not present

## 2016-08-15 NOTE — Progress Notes (Signed)
Office: [phone]  /  Fax: [phone]   HPI:  Chief Complaint: OBESITY Chloe Byrd is here to discuss her progress with her obesity treatment plan. She is on the  keep a food journal with 1200-1500 calories and 80 g protein  and is following her eating plan approximately 95 % of the time. She states she is exercising 0 minutes 0 times per week. Chloe Byrd continues to do well with weight loss. Would like to continue to keep a food journal.  Her weight is 245 lb (111.1 kg) today and has had a weight loss of 10 pounds over a period of 2 weeks since her last visit. She has lost 9 lbs since starting treatment with [place].  Pre-Diabetes Chloe Byrd has a diagnosis of prediabetes based on her elevated HgA1c and was informed this puts her at greater risk of developing diabetes.  She is on Saxenda 3 mg once daily. She is not taking metformin currently and continues to work on diet and exercise to decrease risk of diabetes. She is currently on Saxenda 3mg  daily.  She denies nausea or hypoglycemia.   ALLERGIES: Allergies  Allergen Reactions  . Hydrocodone     MEDICATIONS: Current Outpatient Prescriptions on File Prior to Visit  Medication Sig Dispense Refill  . atorvastatin (LIPITOR) 20 MG tablet Take 1 tablet (20 mg total) by mouth daily. 90 tablet 3  . buPROPion (WELLBUTRIN SR) 150 MG 12 hr tablet Take 1 tablet (150 mg total) by mouth every morning. 30 tablet 0  . doxycycline (VIBRA-TABS) 100 MG tablet Take 1 tablet (100 mg total) by mouth daily. 1 po bid 30 tablet 0  . doxycycline (VIBRA-TABS) 100 MG tablet Take 1 tablet (100 mg total) by mouth daily. 90 tablet 1  . levothyroxine (SYNTHROID, LEVOTHROID) 200 MCG tablet Take 1 tablet (200 mcg total) by mouth daily before breakfast. 90 tablet 3  . Liraglutide -Weight Management (SAXENDA) 18 MG/3ML SOPN Inject 3 mg into the skin daily. 3 pen 0  . montelukast (SINGULAIR) 10 MG tablet Take 1 tablet (10 mg total) by mouth at bedtime. 90 tablet 3  .  norgestrel-ethinyl estradiol (LO/OVRAL,CRYSELLE) 0.3-30 MG-MCG tablet Take 1 tablet by mouth daily. 3 Package 3  . Vitamin D, Ergocalciferol, (DRISDOL) 50000 units CAPS capsule Take 1 capsule (50,000 Units total) by mouth every 7 (seven) days. 12 capsule 3   No current facility-administered medications on file prior to visit.     PAST MEDICAL HISTORY: Past Medical History:  Diagnosis Date  . Allergic rhinitis   . Dysmenorrhea   . Hyperlipidemia   . Hypothyroidism   . Joint pain   . Obesity   . Plantar fasciitis   . Tendonitis     PAST SURGICAL HISTORY: Past Surgical History:  Procedure Laterality Date  . CESAREAN SECTION  2008  . WISDOM TOOTH EXTRACTION     age 62    SOCIAL HISTORY: Social History  Substance Use Topics  . Smoking status: Never Smoker  . Smokeless tobacco: Never Used  . Alcohol use No    FAMILY HISTORY: Family History  Problem Relation Age of Onset  . Arthritis Mother   . Hypertension Mother   . Anxiety disorder Mother   . Sleep apnea Mother   . Diabetes Father   . Alcohol abuse Father   . Depression Father   . Hyperlipidemia Father     ROS: Review of Systems  Constitutional: Positive for weight loss.  Gastrointestinal: Negative for nausea.  Endo/Heme/Allergies:  Negative hypoglycemia    PHYSICAL EXAM: Blood pressure (!) 142/84, pulse 75, temperature 98.4 F (36.9 C), temperature source Oral, height 5\' 3"  (1.6 m), weight 245 lb (111.1 kg), SpO2 98 %. Body mass index is 43.4 kg/m. Physical Exam  Constitutional: She is oriented to person, place, and time. She appears well-developed and well-nourished.  Cardiovascular: Normal rate.   Pulmonary/Chest: Effort normal.  Musculoskeletal: Normal range of motion.  Neurological: She is alert and oriented to person, place, and time.  Skin: Skin is warm and dry.    RECENT LABS AND TESTS: BMET    Component Value Date/Time   NA 141 03/26/2016 1136   K 4.2 03/26/2016 1136   CL 99  03/26/2016 1136   CO2 24 03/26/2016 1136   GLUCOSE 81 03/26/2016 1136   BUN 12 03/26/2016 1136   CREATININE 0.79 03/26/2016 1136   CALCIUM 9.2 03/26/2016 1136   GFRNONAA 90 03/26/2016 1136   GFRAA 104 03/26/2016 1136   Lab Results  Component Value Date   HGBA1C 5.8 (H) 03/26/2016   Lab Results  Component Value Date   INSULIN 23.4 03/26/2016   CBC    Component Value Date/Time   WBC 10.7 03/26/2016 1136   RBC 4.33 03/26/2016 1136   HCT 37.9 03/26/2016 1136   PLT 429 (H) 03/26/2016 1136   MCV 88 03/26/2016 1136   MCH 29.1 03/26/2016 1136   MCHC 33.2 03/26/2016 1136   RDW 14.3 03/26/2016 1136   LYMPHSABS 3.0 03/26/2016 1136   EOSABS 0.2 03/26/2016 1136   BASOSABS 0.0 03/26/2016 1136   Iron/TIBC/Ferritin/ %Sat No results found for: IRON, TIBC, FERRITIN, IRONPCTSAT Lipid Panel     Component Value Date/Time   CHOL 191 03/26/2016 1136   TRIG 160 (H) 03/26/2016 1136   HDL 49 03/26/2016 1136   CHOLHDL 5.4 (H) 02/08/2016 1026   LDLCALC 110 (H) 03/26/2016 1136   Hepatic Function Panel     Component Value Date/Time   PROT 7.2 03/26/2016 1136   ALBUMIN 4.5 03/26/2016 1136   AST 10 03/26/2016 1136   ALT 14 03/26/2016 1136   ALKPHOS 80 03/26/2016 1136   BILITOT 0.3 03/26/2016 1136      Component Value Date/Time   TSH 2.110 03/26/2016 1136   TSH 1.650 02/08/2016 1026    ASSESSMENT AND PLAN: Prediabetes  Morbid obesity (HCC)  PLAN: Pre-Diabetes Chloe Byrd will continue to work on weight loss, exercise, and decreasing simple carbohydrates in her diet to help decrease the risk of diabetes. We dicussed metformin including benefits and risks. She was informed that eating too many simple carbohydrates or too many calories at one sitting increases the likelihood of GI side effects. Chloe Byrd is on Saxenda 3 mg daily.  Chloe Byrd agreed to follow up with [place] as directed to monitor her progress.  Obesity Chloe Byrd is currently in the action stage of change. As such, her goal is to continue  with weight loss efforts She has agreed to keep a food journal with 1200-1500 calories and 80 protein  Chloe Byrd has been instructed to work up to a goal of 150 minutes of combined cardio and strengthening exercise per week for weight loss and overall health benefits. We discussed the following Behavioral Modification Stratagies today: increasing lean protein intake and avoiding temptations, and to increase water intake We discussed various medication options to help [facility] with her weight loss efforts and we both agreed to continue using Saxenda 3 mg daily.  Chloe Byrd has agreed to follow up with our clinic in  2 weeks. She was informed of the importance of frequent follow up visits to maximize her success with intensive lifestyle modifications for her multiple health conditions.  I, [place] CraneJoanne Murray, am acting as scribe for Quillian Quincearen Idalis Hoelting, MD  I have reviewed the above documentation for accuracy and completeness, and I agree with the above. -Quillian Quincearen Parilee Hally, MD

## 2016-08-26 ENCOUNTER — Encounter (INDEPENDENT_AMBULATORY_CARE_PROVIDER_SITE_OTHER): Payer: Self-pay

## 2016-08-26 ENCOUNTER — Other Ambulatory Visit (INDEPENDENT_AMBULATORY_CARE_PROVIDER_SITE_OTHER): Payer: Self-pay | Admitting: Family Medicine

## 2016-08-26 DIAGNOSIS — F32A Depression, unspecified: Secondary | ICD-10-CM

## 2016-08-26 DIAGNOSIS — F329 Major depressive disorder, single episode, unspecified: Secondary | ICD-10-CM

## 2016-08-26 MED FILL — LOW-OGESTREL-28 TABLET: 0.3-30 | 84 days supply | Qty: 84 | Fill #2

## 2016-08-26 MED FILL — VIT D2 1.25 MG (50,000 UNIT: 1.25 MG | 84 days supply | Qty: 12 | Fill #2

## 2016-09-04 ENCOUNTER — Ambulatory Visit (INDEPENDENT_AMBULATORY_CARE_PROVIDER_SITE_OTHER): Payer: 59 | Admitting: Family Medicine

## 2016-09-06 MED FILL — ATORVASTATIN 20 MG TABLET: 20 | 90 days supply | Qty: 90 | Fill #2

## 2016-09-06 MED FILL — LEVOTHYROXINE 200 MCG TAB: 200 | 90 days supply | Qty: 90 | Fill #2

## 2016-09-06 MED FILL — MONTELUKAST SOD 10 MG TAB: 10 | 90 days supply | Qty: 90 | Fill #2

## 2016-09-12 ENCOUNTER — Ambulatory Visit (INDEPENDENT_AMBULATORY_CARE_PROVIDER_SITE_OTHER): Payer: 59 | Admitting: Family Medicine

## 2016-09-12 VITALS — BP 104/70 | HR 62 | Temp 98.0°F | Ht 63.0 in | Wt 246.0 lb

## 2016-09-12 DIAGNOSIS — E559 Vitamin D deficiency, unspecified: Secondary | ICD-10-CM

## 2016-09-12 DIAGNOSIS — E7849 Other hyperlipidemia: Secondary | ICD-10-CM

## 2016-09-12 DIAGNOSIS — R7303 Prediabetes: Secondary | ICD-10-CM

## 2016-09-12 DIAGNOSIS — Z9189 Other specified personal risk factors, not elsewhere classified: Secondary | ICD-10-CM

## 2016-09-12 DIAGNOSIS — E784 Other hyperlipidemia: Secondary | ICD-10-CM

## 2016-09-12 MED ORDER — LIRAGLUTIDE -WEIGHT MANAGEMENT 18 MG/3ML ~~LOC~~ SOPN: 3.0000 mg | PEN_INJECTOR | Freq: Every morning | SUBCUTANEOUS | 0 refills | Status: DC

## 2016-09-12 NOTE — Progress Notes (Signed)
Office: [phone]  /  Fax: [phone]   HPI:   Chief Complaint: OBESITY Chloe Byrd is here to discuss her progress with her obesity treatment plan. She is on the keep a food journal with 1200 to 1500 calories and 80 grams of protein  and is following her eating plan approximately 80 % of the time. She states she is exercising 0 minutes 0 times per week. Chloe Byrd had increased celebration eating and has not journaled. She is ready to get back on track. Her weight is 246 lb (111.6 kg) today and has had a weight gain of 1 pound over a period of 4 weeks since her last visit. She has lost 8 lbs since starting treatment with [place].  Vitamin D deficiency Chloe Byrd has a diagnosis of vitamin D deficiency. She is currently taking vit D and denies nausea, vomiting or muscle weakness.  Hyperlipidemia Chloe Byrd has hyperlipidemia and has been trying to improve her cholesterol levels with intensive lifestyle modification including a low saturated fat diet, exercise and weight loss. She denies any chest pain, claudication or myalgias.  Pre-Diabetes Chloe Byrd has a diagnosis of pre-diabetes based on her elevated Hgb A1c and was informed this puts her at greater risk of developing diabetes. She is not taking metformin currently and continues to work on diet and exercise to decrease risk of diabetes. She denies nausea, polyphagia or hypoglycemia.  At risk for diabetes Chloe Byrd is at higher than average risk for developing diabetes due to her obesity and pre-diabetes. She currently denies polyuria or polydipsia.  ALLERGIES: Allergies  Allergen Reactions  . Hydrocodone     MEDICATIONS: Current Outpatient Prescriptions on File Prior to Visit  Medication Sig Dispense Refill  . atorvastatin (LIPITOR) 20 MG tablet Take 1 tablet (20 mg total) by mouth daily. 90 tablet 3  . buPROPion (WELLBUTRIN SR) 150 MG 12 hr tablet Take 1 tablet (150 mg total) by mouth every morning. 30 tablet 0  . doxycycline (VIBRA-TABS) 100 MG tablet  Take 1 tablet (100 mg total) by mouth daily. 1 po bid 30 tablet 0  . doxycycline (VIBRA-TABS) 100 MG tablet Take 1 tablet (100 mg total) by mouth daily. 90 tablet 1  . levothyroxine (SYNTHROID, LEVOTHROID) 200 MCG tablet Take 1 tablet (200 mcg total) by mouth daily before breakfast. 90 tablet 3  . montelukast (SINGULAIR) 10 MG tablet Take 1 tablet (10 mg total) by mouth at bedtime. 90 tablet 3  . norgestrel-ethinyl estradiol (LO/OVRAL,CRYSELLE) 0.3-30 MG-MCG tablet Take 1 tablet by mouth daily. 3 Package 3  . Vitamin D, Ergocalciferol, (DRISDOL) 50000 units CAPS capsule Take 1 capsule (50,000 Units total) by mouth every 7 (seven) days. 12 capsule 3   No current facility-administered medications on file prior to visit.     PAST MEDICAL HISTORY: Past Medical History:  Diagnosis Date  . Allergic rhinitis   . Dysmenorrhea   . Hyperlipidemia   . Hypothyroidism   . Joint pain   . Obesity   . Plantar fasciitis   . Tendonitis     PAST SURGICAL HISTORY: Past Surgical History:  Procedure Laterality Date  . CESAREAN SECTION  2008  . WISDOM TOOTH EXTRACTION     age 59    SOCIAL HISTORY: Social History  Substance Use Topics  . Smoking status: Never Smoker  . Smokeless tobacco: Never Used  . Alcohol use No    FAMILY HISTORY: Family History  Problem Relation Age of Onset  . Arthritis Mother   . Hypertension Mother   .  Anxiety disorder Mother   . Sleep apnea Mother   . Diabetes Father   . Alcohol abuse Father   . Depression Father   . Hyperlipidemia Father     ROS: Review of Systems  Constitutional: Negative for weight loss.  Cardiovascular: Negative for chest pain and claudication.  Gastrointestinal: Negative for nausea and vomiting.  Genitourinary: Negative for frequency.  Musculoskeletal: Negative for myalgias.       Negative muscle weakness  Endo/Heme/Allergies: Negative for polydipsia.       Negative polyphagia Negative hypoglycemia    PHYSICAL EXAM: Blood  pressure 104/70, pulse 62, temperature 98 F (36.7 C), temperature source Oral, height 5\' 3"  (1.6 m), weight 246 lb (111.6 kg), SpO2 97 %. Body mass index is 43.58 kg/m. Physical Exam  Constitutional: She is oriented to person, place, and time. She appears well-developed and well-nourished.  Cardiovascular: Normal rate.   Pulmonary/Chest: Effort normal.  Musculoskeletal: Normal range of motion.  Neurological: She is oriented to person, place, and time.  Skin: Skin is warm and dry.  Psychiatric: She has a normal mood and affect. Her behavior is normal.  Vitals reviewed.   RECENT LABS AND TESTS: BMET    Component Value Date/Time   NA 141 03/26/2016 1136   K 4.2 03/26/2016 1136   CL 99 03/26/2016 1136   CO2 24 03/26/2016 1136   GLUCOSE 81 03/26/2016 1136   BUN 12 03/26/2016 1136   CREATININE 0.79 03/26/2016 1136   CALCIUM 9.2 03/26/2016 1136   GFRNONAA 90 03/26/2016 1136   GFRAA 104 03/26/2016 1136   Lab Results  Component Value Date   HGBA1C 5.8 (H) 03/26/2016   Lab Results  Component Value Date   INSULIN 23.4 03/26/2016   CBC    Component Value Date/Time   WBC 10.7 03/26/2016 1136   RBC 4.33 03/26/2016 1136   HGB 12.6 03/26/2016 1136   HCT 37.9 03/26/2016 1136   PLT 429 (H) 03/26/2016 1136   MCV 88 03/26/2016 1136   MCH 29.1 03/26/2016 1136   MCHC 33.2 03/26/2016 1136   RDW 14.3 03/26/2016 1136   LYMPHSABS 3.0 03/26/2016 1136   EOSABS 0.2 03/26/2016 1136   BASOSABS 0.0 03/26/2016 1136   Iron/TIBC/Ferritin/ %Sat No results found for: IRON, TIBC, FERRITIN, IRONPCTSAT Lipid Panel     Component Value Date/Time   CHOL 191 03/26/2016 1136   TRIG 160 (H) 03/26/2016 1136   HDL 49 03/26/2016 1136   CHOLHDL 5.4 (H) 02/08/2016 1026   LDLCALC 110 (H) 03/26/2016 1136   Hepatic Function Panel     Component Value Date/Time   PROT 7.2 03/26/2016 1136   ALBUMIN 4.5 03/26/2016 1136   AST 10 03/26/2016 1136   ALT 14 03/26/2016 1136   ALKPHOS 80 03/26/2016 1136    BILITOT 0.3 03/26/2016 1136      Component Value Date/Time   TSH 2.110 03/26/2016 1136   TSH 1.650 02/08/2016 1026    ASSESSMENT AND PLAN: Vitamin D deficiency - Plan: VITAMIN D 25 Hydroxy (Vit-D Deficiency, Fractures)  Other hyperlipidemia - Plan: Lipid Panel With LDL/HDL Ratio  Prediabetes - Plan: Comprehensive metabolic panel, Hemoglobin A1c, Insulin, random  At risk for diabetes mellitus  Morbid obesity (HCC) - Plan: Liraglutide -Weight Management (SAXENDA) 18 MG/3ML SOPN  PLAN:  Vitamin D Deficiency Chloe Byrd was informed that low vitamin D levels contributes to fatigue and are associated with obesity, breast, and colon cancer. She agrees to continue to take prescription Vit D @50 ,000 IU every week and we  will check labs and will follow up for routine testing of vitamin D, at least 2-3 times per year. She was informed of the risk of over-replacement of vitamin D and agrees to not increase her dose unless he discusses this with us first. Chloe Byrd agrees to follow up with our clinic in 2 to 3 weeks.  Hyperlipidemia Chloe Byrd was informed of the American Heart Association Guidelines emphasizing intensive lifestyle modifications as the first line treatment for hyperlipidemia. We discussed many lifestyle modifications today in depth, and Chloe Byrd will continue to work on decreasing saturated fats such as fatty red meat, butter and many fried foods. She will also increase vegetables and lean protein in her diet and continue to work on exercise and weight loss efforts. We will check labs and Chloe Byrd agrees to continue lipitor and follow up with our clinic in 2 to 3 weeks.  Pre-Diabetes Chloe Byrd will continue to work on weight loss, exercise, and decreasing simple carbohydrates in her diet to help decrease the risk of diabetes. She was informed that eating too many simple carbohydrates or too many calories at one sitting increases the likelihood of GI side effects. We will check labs and Chloe Byrd agreed to follow  up with us as directed to monitor her progress.  Diabetes risk counselling Chloe Byrd was given extended (at least 15 minutes) diabetes prevention counseling today. She is 46 y.o. female and has risk factors for diabetes including obesity and pre-diabetes. We discussed intensive lifestyle modifications today with an emphasis on weight loss as well as increasing exercise and decreasing simple carbohydrates in her diet.  Obesity Chloe Byrd is currently in the action stage of change. As such, her goal is to continue with weight loss efforts She has agreed to keep a food journal with 1200 to 1500 calories and 80+ grams of protein daily Chloe Byrd has been instructed to work up to a goal of 150 minutes of combined cardio and strengthening exercise per week for weight loss and overall health benefits. We discussed the following Behavioral Modification Strategies today: increasing lean protein intake and keep a strict food journal  Chloe Byrd has agreed to follow up with our clinic in 2 to 3 weeks. She was informed of the importance of frequent follow up visits to maximize her success with intensive lifestyle modifications for her multiple health conditions.  I, [place] CraneJoanne Murray, am acting as transcriptionist for Quillian Quincearen Linzy Darling, MD  I have reviewed the above documentation for accuracy and completeness, and I agree with the above. -Quillian Quincearen Lindsey Hommel, MD  OBESITY BEHAVIORAL INTERVENTION VISIT  Today's visit was # 10 out of 22.  Starting weight: 254 lbs Starting date: 03/26/16 Today's weight : 246 lbs Today's date: 09/12/2016 Total lbs lost to date: 8 (Patients must lose 7 lbs in the first 6 months to continue with counseling)   ASK: We discussed the diagnosis of obesity with Vertis KelchAngel Byrd Test today and Chloe KobusAngel agreed to give us permission to discuss obesity behavioral modification therapy today.  ASSESS: Chloe Byrd has the diagnosis of obesity and her BMI today is 43.7 Chloe Byrd is in the action stage of change   ADVISE: Chloe Byrd  was educated on the multiple health risks of obesity as well as the benefit of weight loss to improve her health. She was advised of the need for long term treatment and the importance of lifestyle modifications.  AGREE: Multiple dietary modification options and treatment options were discussed and  Chloe Byrd agreed to keep a food journal with 1200 to 1500 calories and 80+ grams of  protein daily We discussed the following Behavioral Modification Strategies today: increasing lean protein intake and keep a strict food journal

## 2016-09-13 LAB — COMPREHENSIVE METABOLIC PANEL
ALBUMIN: 4.2 g/dL (ref 3.5–5.5)
ALK PHOS: 75 IU/L (ref 39–117)
ALT: 15 IU/L (ref 0–32)
AST: 13 IU/L (ref 0–40)
Albumin/Globulin Ratio: 1.7 (ref 1.2–2.2)
BUN / CREAT RATIO: 13 (ref 9–23)
BUN: 10 mg/dL (ref 6–24)
CHLORIDE: 105 mmol/L (ref 96–106)
CO2: 20 mmol/L (ref 20–29)
Calcium: 9 mg/dL (ref 8.7–10.2)
Creatinine, Ser: 0.77 mg/dL (ref 0.57–1.00)
GFR calc Af Amer: 107 mL/min/{1.73_m2} (ref >59)
GFR calc non Af Amer: 93 mL/min/{1.73_m2} (ref >59)
GLUCOSE: 89 mg/dL (ref 65–99)
Globulin, Total: 2.5 g/dL (ref 1.5–4.5)
Potassium: 4.4 mmol/L (ref 3.5–5.2)
Sodium: 140 mmol/L (ref 134–144)
Total Protein: 6.7 g/dL (ref 6.0–8.5)

## 2016-09-13 LAB — LIPID PANEL WITH LDL/HDL RATIO
Cholesterol, Total: 144 mg/dL (ref 100–199)
HDL: 42 mg/dL (ref >39)
LDL CALC: 79 mg/dL (ref 0–99)
LDl/HDL Ratio: 1.9 ratio (ref 0.0–3.2)
Triglycerides: 115 mg/dL (ref 0–149)
VLDL Cholesterol Cal: 23 mg/dL (ref 5–40)

## 2016-09-13 LAB — VITAMIN D 25 HYDROXY (VIT D DEFICIENCY, FRACTURES): Vit D, 25-Hydroxy: 35.2 ng/mL (ref 30.0–100.0)

## 2016-09-13 LAB — INSULIN, RANDOM: INSULIN: 19.6 u[IU]/mL (ref 2.6–24.9)

## 2016-09-13 LAB — HEMOGLOBIN A1C
Est. average glucose Bld gHb Est-mCnc: 111 mg/dL
HEMOGLOBIN A1C: 5.5 % (ref 4.8–5.6)

## 2016-09-27 ENCOUNTER — Other Ambulatory Visit (INDEPENDENT_AMBULATORY_CARE_PROVIDER_SITE_OTHER): Payer: Self-pay | Admitting: Family Medicine

## 2016-09-27 DIAGNOSIS — F329 Major depressive disorder, single episode, unspecified: Secondary | ICD-10-CM

## 2016-09-27 DIAGNOSIS — F32A Depression, unspecified: Secondary | ICD-10-CM

## 2016-09-30 ENCOUNTER — Encounter (INDEPENDENT_AMBULATORY_CARE_PROVIDER_SITE_OTHER): Payer: Self-pay

## 2016-10-01 ENCOUNTER — Other Ambulatory Visit (INDEPENDENT_AMBULATORY_CARE_PROVIDER_SITE_OTHER): Payer: Self-pay

## 2016-10-01 ENCOUNTER — Other Ambulatory Visit: Payer: Self-pay | Admitting: Family

## 2016-10-01 DIAGNOSIS — F32A Depression, unspecified: Secondary | ICD-10-CM

## 2016-10-01 DIAGNOSIS — F329 Major depressive disorder, single episode, unspecified: Secondary | ICD-10-CM

## 2016-10-01 MED ORDER — BUPROPION HCL ER (SR) 150 MG PO TB12: 300.0000 mg | ORAL_TABLET | Freq: Every morning | ORAL | 3 refills | Status: DC

## 2016-10-01 MED FILL — BUPROPION SR 150 MG TABLET: 150 | 90 days supply | Qty: 180 | Fill #0

## 2016-10-02 ENCOUNTER — Other Ambulatory Visit (INDEPENDENT_AMBULATORY_CARE_PROVIDER_SITE_OTHER): Payer: Self-pay

## 2016-10-02 ENCOUNTER — Encounter (INDEPENDENT_AMBULATORY_CARE_PROVIDER_SITE_OTHER): Payer: Self-pay

## 2016-10-02 DIAGNOSIS — F32A Depression, unspecified: Secondary | ICD-10-CM

## 2016-10-02 DIAGNOSIS — F329 Major depressive disorder, single episode, unspecified: Secondary | ICD-10-CM

## 2016-10-02 MED ORDER — BUPROPION HCL ER (SR) 150 MG PO TB12: 300.0000 mg | ORAL_TABLET | Freq: Every morning | ORAL | 0 refills | Status: DC

## 2016-10-03 MED FILL — SAXENDA 18 MG/3 ML PEN: 18 | 30 days supply | Qty: 15 | Fill #0

## 2016-10-10 ENCOUNTER — Ambulatory Visit (INDEPENDENT_AMBULATORY_CARE_PROVIDER_SITE_OTHER): Payer: 59 | Admitting: Family Medicine

## 2016-10-31 ENCOUNTER — Ambulatory Visit (INDEPENDENT_AMBULATORY_CARE_PROVIDER_SITE_OTHER): Payer: 59 | Admitting: Family Medicine

## 2016-10-31 VITALS — BP 101/67 | HR 69 | Temp 98.3°F | Ht 63.0 in | Wt 240.0 lb

## 2016-10-31 DIAGNOSIS — Z6841 Body Mass Index (BMI) 40.0 and over, adult: Secondary | ICD-10-CM | POA: Diagnosis not present

## 2016-10-31 DIAGNOSIS — E669 Obesity, unspecified: Secondary | ICD-10-CM | POA: Diagnosis not present

## 2016-10-31 DIAGNOSIS — R7303 Prediabetes: Secondary | ICD-10-CM

## 2016-10-31 DIAGNOSIS — IMO0001 Reserved for inherently not codable concepts without codable children: Secondary | ICD-10-CM

## 2016-10-31 NOTE — Progress Notes (Signed)
Office: [phone]  /  Fax: [phone]   HPI:   Chief Complaint: OBESITY Chloe Byrd is here to discuss her progress with her obesity treatment plan. She is on the keep a food journal with 1200 to 1500 calories and 80+ grams of protein plan and is following her eating plan approximately 50 % of the time. She states she is walking 30 minutes 2 times per week. Chloe Byrd continues to do well with weight loss. She has gone in between the low carb plan and journaling. She is tolerating Saxenda 1.8 mg qd and hunger is controlled. Her weight is 240 lb (108.9 kg) today and has had a weight loss of 6 pounds over a period of 7 weeks since her last visit. She has lost 14 lbs since starting treatment with [place].  Pre-Diabetes Chloe Byrd has a diagnosis of pre-diabetes based on her elevated HgA1c and was informed this puts her at greater risk of developing diabetes. She is taking Saxenda for weight loss but this also treats pre-diabetes. She continues to work on diet and exercise to decrease risk of diabetes. She denies nausea, vomiting or hypoglycemia.   ALLERGIES: Allergies  Allergen Reactions   Hydrocodone     MEDICATIONS: Current Outpatient Prescriptions on File Prior to Visit  Medication Sig Dispense Refill   atorvastatin (LIPITOR) 20 MG tablet Take 1 tablet (20 mg total) by mouth daily. 90 tablet 3   buPROPion (WELLBUTRIN SR) 150 MG 12 hr tablet Take 2 tablets (300 mg total) by mouth every morning. 18 tablet 0   doxycycline (VIBRA-TABS) 100 MG tablet Take 1 tablet (100 mg total) by mouth daily. 1 po bid 30 tablet 0   doxycycline (VIBRA-TABS) 100 MG tablet Take 1 tablet (100 mg total) by mouth daily. 90 tablet 1   levothyroxine (SYNTHROID, LEVOTHROID) 200 MCG tablet Take 1 tablet (200 mcg total) by mouth daily before breakfast. 90 tablet 3   Liraglutide -Weight Management (SAXENDA) 18 MG/3ML SOPN Inject 3 mg into the skin every morning. 3 pen 0   montelukast (SINGULAIR) 10 MG tablet Take 1 tablet  (10 mg total) by mouth at bedtime. 90 tablet 3   norgestrel-ethinyl estradiol (LO/OVRAL,CRYSELLE) 0.3-30 MG-MCG tablet Take 1 tablet by mouth daily. 3 Package 3   Vitamin D, Ergocalciferol, (DRISDOL) 50000 units CAPS capsule Take 1 capsule (50,000 Units total) by mouth every 7 (seven) days. 12 capsule 3   No current facility-administered medications on file prior to visit.     PAST MEDICAL HISTORY: Past Medical History:  Diagnosis Date   Allergic rhinitis    Dysmenorrhea    Hyperlipidemia    Hypothyroidism    Joint pain    Obesity    Plantar fasciitis    Tendonitis     PAST SURGICAL HISTORY: Past Surgical History:  Procedure Laterality Date   CESAREAN SECTION  2008   WISDOM TOOTH EXTRACTION     age 51    SOCIAL HISTORY: Social History  Substance Use Topics   Smoking status: Never Smoker   Smokeless tobacco: Never Used   Alcohol use No    FAMILY HISTORY: Family History  Problem Relation Age of Onset   Arthritis Mother    Hypertension Mother    Anxiety disorder Mother    Sleep apnea Mother    Diabetes Father    Alcohol abuse Father    Depression Father    Hyperlipidemia Father     ROS: Review of Systems  Constitutional: Positive for weight loss.  Gastrointestinal: Negative for nausea  and vomiting.  Endo/Heme/Allergies:       Negative hypoglycemia    PHYSICAL EXAM: Blood pressure 101/67, pulse 69, temperature 98.3 F (36.8 C), temperature source Oral, height 5\' 3"  (1.6 m), weight 240 lb (108.9 kg), SpO2 98 %. Body mass index is 42.51 kg/m. Physical Exam  Constitutional: She is oriented to person, place, and time. She appears well-developed and well-nourished.  Cardiovascular: Normal rate.   Pulmonary/Chest: Effort normal.  Musculoskeletal: Normal range of motion.  Neurological: She is oriented to person, place, and time.  Skin: Skin is warm and dry.  Psychiatric: She has a normal mood and affect. Her behavior is normal.  Vitals  reviewed.   RECENT LABS AND TESTS: BMET    Component Value Date/Time   NA 140 09/12/2016 0810   K 4.4 09/12/2016 0810   CL 105 09/12/2016 0810   CO2 20 09/12/2016 0810   GLUCOSE 89 09/12/2016 0810   BUN 10 09/12/2016 0810   CREATININE 0.77 09/12/2016 0810   CALCIUM 9.0 09/12/2016 0810   GFRNONAA 93 09/12/2016 0810   GFRAA 107 09/12/2016 0810   Lab Results  Component Value Date   HGBA1C 5.5 09/12/2016   HGBA1C 5.8 (H) 03/26/2016   Lab Results  Component Value Date   INSULIN 19.6 09/12/2016   INSULIN 23.4 03/26/2016   CBC    Component Value Date/Time   WBC 10.7 03/26/2016 1136   RBC 4.33 03/26/2016 1136   HGB 12.6 03/26/2016 1136   HCT 37.9 03/26/2016 1136   PLT 429 (H) 03/26/2016 1136   MCV 88 03/26/2016 1136   MCH 29.1 03/26/2016 1136   MCHC 33.2 03/26/2016 1136   RDW 14.3 03/26/2016 1136   LYMPHSABS 3.0 03/26/2016 1136   EOSABS 0.2 03/26/2016 1136   BASOSABS 0.0 03/26/2016 1136   Iron/TIBC/Ferritin/ %Sat No results found for: IRON, TIBC, FERRITIN, IRONPCTSAT Lipid Panel     Component Value Date/Time   CHOL 144 09/12/2016 0810   TRIG 115 09/12/2016 0810   HDL 42 09/12/2016 0810   CHOLHDL 5.4 (H) 02/08/2016 1026   LDLCALC 79 09/12/2016 0810   Hepatic Function Panel     Component Value Date/Time   PROT 6.7 09/12/2016 0810   ALBUMIN 4.2 09/12/2016 0810   AST 13 09/12/2016 0810   ALT 15 09/12/2016 0810   ALKPHOS 75 09/12/2016 0810   BILITOT <0.2 09/12/2016 0810      Component Value Date/Time   TSH 2.110 03/26/2016 1136   TSH 1.650 02/08/2016 1026    ASSESSMENT AND PLAN: Prediabetes  Class 3 obesity without serious comorbidity with body mass index (BMI) of 40.0 to 44.9 in adult, unspecified obesity type (HCC)  PLAN:  Pre-Diabetes Chloe Byrd will continue to work on weight loss, exercise, and decreasing simple carbohydrates in her diet to help decrease the risk of diabetes. She was informed that eating too many simple carbohydrates or too many  calories at one sitting increases the likelihood of GI side effects. Chloe Byrd agrees to [employer] Saxenda and we will re-check labs in 6 weeks. Chloe Byrd agreed to follow up with us as directed to monitor her progress.  We spent > than 50% of the 15 minute visit on the counseling as documented in the note.  Obesity Chloe Byrd is currently in the action stage of change. As such, her goal is to continue with weight loss efforts She has agreed to keep a food journal with 1200 to 1500 calories and 80+ grams of protein  Chloe Byrd has been instructed to work up  to a goal of 150 minutes of combined cardio and strengthening exercise per week for weight loss and overall health benefits. We discussed the following Behavioral Modification Strategies today: increasing lean protein intake and decreasing simple carbohydrates   Mylea has agreed to follow up with our clinic in 2 weeks. She was informed of the importance of frequent follow up visits to maximize her success with intensive lifestyle modifications for her multiple health conditions.  I, [place] Crane, am acting as transcriptionist for Quillian Quince, MD  I have reviewed the above documentation for accuracy and completeness, and I agree with the above. -Quillian Quince, MD  OBESITY BEHAVIORAL INTERVENTION VISIT  Today's visit was # 11 out of 22.  Starting weight: 254 lbs Starting date: 03/26/16 Today's weight : 240 lbs Today's date: 10/31/2016 Total lbs lost to date: 14 (Patients must lose 7 lbs in the first 6 months to continue with counseling)   ASK: We discussed the diagnosis of obesity with [name] today and Chloe Kobus agreed to give [place] permission to discuss obesity behavioral modification therapy today.  ASSESS: Shana has the diagnosis of obesity and her BMI today is 42.6 Azyria is in the action stage of change   ADVISE: Tamera was educated on the multiple health risks of obesity as well as the benefit of weight loss to improve her health. She was  advised of the need for long term treatment and the importance of lifestyle modifications.  AGREE: Multiple dietary modification options and treatment options were discussed and  Omar agreed to keep a food journal with 1200 to 1500 calories and 80+ grams of protein  We discussed the following Behavioral Modification Strategies today: increasing lean protein intake and decreasing simple carbohydrates

## 2016-11-15 MED FILL — LOW-OGESTREL-28 TABLET: 0.3-30 | 84 days supply | Qty: 84 | Fill #3

## 2016-11-21 ENCOUNTER — Ambulatory Visit (INDEPENDENT_AMBULATORY_CARE_PROVIDER_SITE_OTHER): Payer: 59 | Admitting: Family Medicine

## 2016-11-21 VITALS — BP 110/67 | HR 69 | Temp 98.1°F | Ht 63.0 in | Wt 236.0 lb

## 2016-11-21 DIAGNOSIS — E669 Obesity, unspecified: Secondary | ICD-10-CM | POA: Diagnosis not present

## 2016-11-21 DIAGNOSIS — E559 Vitamin D deficiency, unspecified: Secondary | ICD-10-CM

## 2016-11-21 DIAGNOSIS — IMO0001 Reserved for inherently not codable concepts without codable children: Secondary | ICD-10-CM

## 2016-11-21 DIAGNOSIS — Z6841 Body Mass Index (BMI) 40.0 and over, adult: Secondary | ICD-10-CM

## 2016-11-21 NOTE — Progress Notes (Signed)
Office: [phone]  /  Fax: [phone]   HPI:   Chief Complaint: OBESITY Chloe Byrd is here to discuss her progress with her obesity treatment plan. She is on the keep a food journal with 1500 calories and 80+ protein and is following her eating plan approximately 60 % of the time. She states she is exercising walking 30 minutes 3 times per week. Chloe Byrd is doing well with weight loss, she is tolerating the saxenda @ 1.8 q day. She is walking for exercise and doing some stretching exercises.  Her weight is 236 lb (107 kg) today and has had a weight loss of 4 pounds over a period of 3 weeks since her last visit. She has lost 18 lbs since starting treatment with [place].  Vitamin D deficiency Chloe Byrd has a diagnosis of vitamin D deficiency. She is currently taking vit D pretty regularly but last level not yet at goal. Chloe Byrd notes fatigue but denies nausea, vomiting or muscle weakness.  ALLERGIES: Allergies  Allergen Reactions  . Hydrocodone     MEDICATIONS: Current Outpatient Prescriptions on File Prior to Visit  Medication Sig Dispense Refill  . atorvastatin (LIPITOR) 20 MG tablet Take 1 tablet (20 mg total) by mouth daily. 90 tablet 3  . buPROPion (WELLBUTRIN SR) 150 MG 12 hr tablet Take 2 tablets (300 mg total) by mouth every morning. 18 tablet 0  . doxycycline (VIBRA-TABS) 100 MG tablet Take 1 tablet (100 mg total) by mouth daily. 1 po bid 30 tablet 0  . doxycycline (VIBRA-TABS) 100 MG tablet Take 1 tablet (100 mg total) by mouth daily. 90 tablet 1  . levothyroxine (SYNTHROID, LEVOTHROID) 200 MCG tablet Take 1 tablet (200 mcg total) by mouth daily before breakfast. 90 tablet 3  . Liraglutide -Weight Management (SAXENDA) 18 MG/3ML SOPN Inject 3 mg into the skin every morning. 3 pen 0  . montelukast (SINGULAIR) 10 MG tablet Take 1 tablet (10 mg total) by mouth at bedtime. 90 tablet 3  . norgestrel-ethinyl estradiol (LO/OVRAL,CRYSELLE) 0.3-30 MG-MCG tablet Take 1 tablet by mouth daily. 3  Package 3  . Vitamin D, Ergocalciferol, (DRISDOL) 50000 units CAPS capsule Take 1 capsule (50,000 Units total) by mouth every 7 (seven) days. 12 capsule 3   No current facility-administered medications on file prior to visit.     PAST MEDICAL HISTORY: Past Medical History:  Diagnosis Date  . Allergic rhinitis   . Dysmenorrhea   . Hyperlipidemia   . Hypothyroidism   . Joint pain   . Obesity   . Plantar fasciitis   . Tendonitis     PAST SURGICAL HISTORY: Past Surgical History:  Procedure Laterality Date  . CESAREAN SECTION  2008  . WISDOM TOOTH EXTRACTION     age 30    SOCIAL HISTORY: Social History  Substance Use Topics  . Smoking status: Never Smoker  . Smokeless tobacco: Never Used  . Alcohol use No    FAMILY HISTORY: Family History  Problem Relation Age of Onset  . Arthritis Mother   . Hypertension Mother   . Anxiety disorder Mother   . Sleep apnea Mother   . Diabetes Father   . Alcohol abuse Father   . Depression Father   . Hyperlipidemia Father     ROS: Review of Systems  Constitutional: Positive for malaise/fatigue and weight loss.  Gastrointestinal: Negative for nausea and vomiting.  Musculoskeletal:       Negative muscle weakness    PHYSICAL EXAM: Blood pressure 110/67, pulse 69, temperature 98.1  F (36.7 C), temperature source Oral, height 5\' 3"  (1.6 m), weight 236 lb (107 kg), last menstrual period 11/11/2016, SpO2 97 %. Body mass index is 41.81 kg/m. Physical Exam  Constitutional: She is oriented to person, place, and time. She appears well-developed and well-nourished.  Cardiovascular: Normal rate.   Pulmonary/Chest: Effort normal.  Musculoskeletal: Normal range of motion.  Neurological: She is oriented to person, place, and time.  Skin: Skin is warm and dry.  Psychiatric: She has a normal mood and affect. Her behavior is normal.  Vitals reviewed.   RECENT LABS AND TESTS: BMET    Component Value Date/Time   NA 140 09/12/2016 0810     K 4.4 09/12/2016 0810   CL 105 09/12/2016 0810   CO2 20 09/12/2016 0810   GLUCOSE 89 09/12/2016 0810   BUN 10 09/12/2016 0810   CREATININE 0.77 09/12/2016 0810   CALCIUM 9.0 09/12/2016 0810   GFRNONAA 93 09/12/2016 0810   GFRAA 107 09/12/2016 0810   Lab Results  Component Value Date   HGBA1C 5.5 09/12/2016   HGBA1C 5.8 (H) 03/26/2016   Lab Results  Component Value Date   INSULIN 19.6 09/12/2016   INSULIN 23.4 03/26/2016   CBC    Component Value Date/Time   WBC 10.7 03/26/2016 1136   RBC 4.33 03/26/2016 1136   HGB 12.6 03/26/2016 1136   HCT 37.9 03/26/2016 1136   PLT 429 (H) 03/26/2016 1136   MCV 88 03/26/2016 1136   MCH 29.1 03/26/2016 1136   MCHC 33.2 03/26/2016 1136   RDW 14.3 03/26/2016 1136   LYMPHSABS 3.0 03/26/2016 1136   EOSABS 0.2 03/26/2016 1136   BASOSABS 0.0 03/26/2016 1136   Iron/TIBC/Ferritin/ %Sat No results found for: IRON, TIBC, FERRITIN, IRONPCTSAT Lipid Panel     Component Value Date/Time   CHOL 144 09/12/2016 0810   TRIG 115 09/12/2016 0810   HDL 42 09/12/2016 0810   CHOLHDL 5.4 (H) 02/08/2016 1026   LDLCALC 79 09/12/2016 0810   Hepatic Function Panel     Component Value Date/Time   PROT 6.7 09/12/2016 0810   ALBUMIN 4.2 09/12/2016 0810   AST 13 09/12/2016 0810   ALT 15 09/12/2016 0810   ALKPHOS 75 09/12/2016 0810   BILITOT <0.2 09/12/2016 0810      Component Value Date/Time   TSH 2.110 03/26/2016 1136   TSH 1.650 02/08/2016 1026    ASSESSMENT AND PLAN: Vitamin D deficiency  Class 3 obesity with serious comorbidity and body mass index (BMI) of 40.0 to 44.9 in adult, unspecified obesity type (HCC)  PLAN:  Vitamin D Deficiency Chloe Byrd was informed that low vitamin D levels contributes to fatigue and are associated with obesity, breast, and colon cancer. She agrees to continue to take prescription Vit D @50 ,000 IU every week and will follow up for routine testing of vitamin D, at least 2-3 times per year. She was informed of the  risk of over-replacement of vitamin D and agrees to not increase her dose unless he discusses this with [place] first. We will recheck labs in 1 month and Chloe Byrd agrees follow up with our clinic in 2 to 3 weeks.  We spent > than 50% of the 15 minute visit on the counseling as documented in the note.  Obesity Chloe Byrd is currently in the action stage of change. As such, her goal is to continue with weight loss efforts She has agreed to keep a food journal with 1500 calories and 80+ protein daily Chloe Byrd has been instructed  to work up to a goal of 150 minutes of combined cardio and strengthening exercise per week for weight loss and overall health benefits. We discussed the following Behavioral Modification Strategies today: increasing lean protein intake, planning for success, keep a strict food journal   Chloe Byrd has agreed to follow up with our clinic in 2 to 3 weeks. She was informed of the importance of frequent follow up visits to maximize her success with intensive lifestyle modifications for her multiple health conditions.  I, Burt KnackSharon Martin, am acting as transcriptionist for Chloe Quincearen Jenisa Monty, MD  I have reviewed the above documentation for accuracy and completeness, and I agree with the above. -Chloe Quincearen Maigen Mozingo, MD    OBESITY BEHAVIORAL INTERVENTION VISIT  Today's visit was # 12 out of 22.  Starting weight: 254 lbs Starting date: 03/26/16 Today's weight : 236 lbs  Today's date: 11/21/16 Total lbs lost to date: 218 (Patients must lose 7 lbs in the first 6 months to continue with counseling)   ASK: We discussed the diagnosis of obesity with Chloe Byrd today and Chloe KobusAngel agreed to give us permission to discuss obesity behavioral modification therapy today.  ASSESS: Chloe Byrd has the diagnosis of obesity and her BMI today is 9341 [place] is in the action stage of change   ADVISE: Chloe Byrd was educated on the multiple health risks of obesity as well as the benefit of weight loss to improve her health. She  was advised of the need for long term treatment and the importance of lifestyle modifications.  AGREE: Multiple dietary modification options and treatment options were discussed and  Chloe Byrd agreed to keep a food journal with 1500 calories and 80+ protein daily We discussed the following Behavioral Modification Strategies today: increasing lean protein intake, planning for success, and keep a strict food journal.

## 2016-11-22 ENCOUNTER — Other Ambulatory Visit: Payer: Self-pay | Admitting: Family

## 2016-12-04 ENCOUNTER — Other Ambulatory Visit: Payer: Self-pay | Admitting: Family

## 2016-12-04 MED FILL — ATORVASTATIN 20 MG TABLET: 20 | 90 days supply | Qty: 90 | Fill #3

## 2016-12-04 MED FILL — LEVOTHYROXINE 200 MCG TAB: 200 | 90 days supply | Qty: 90 | Fill #3

## 2016-12-04 MED FILL — MONTELUKAST SOD 10 MG TAB: 10 | 90 days supply | Qty: 90 | Fill #3

## 2016-12-04 MED FILL — VIT D2 1.25 MG (50,000 UNIT: 1.25 MG | 84 days supply | Qty: 12 | Fill #0

## 2016-12-12 ENCOUNTER — Ambulatory Visit (INDEPENDENT_AMBULATORY_CARE_PROVIDER_SITE_OTHER): Payer: 59 | Admitting: Family Medicine

## 2016-12-12 VITALS — BP 105/67 | HR 62 | Temp 98.4°F | Ht 63.0 in | Wt 235.0 lb

## 2016-12-12 DIAGNOSIS — E669 Obesity, unspecified: Secondary | ICD-10-CM

## 2016-12-12 DIAGNOSIS — Z6841 Body Mass Index (BMI) 40.0 and over, adult: Secondary | ICD-10-CM

## 2016-12-12 DIAGNOSIS — E559 Vitamin D deficiency, unspecified: Secondary | ICD-10-CM

## 2016-12-12 DIAGNOSIS — IMO0001 Reserved for inherently not codable concepts without codable children: Secondary | ICD-10-CM

## 2016-12-12 NOTE — Progress Notes (Signed)
Office: [phone]  /  Fax: [phone]   HPI:   Chief Complaint: OBESITY Chloe Byrd is here to discuss her progress with her obesity treatment plan. She is on the  follow the Category 2 plan and is following her eating plan approximately 50 % of the time. She states she is exercising 0 minutes 0 times per week. Chloe Byrd continues to do well with weight loss on Saxenda 1.8 mg and she denies nausea or vomiting and hunger is mostly controlled. Her weight is 235 lb (106.6 kg) today and has had a weight loss of 1 pound over a period of 3 weeks since her last visit. She has lost 19 lbs since starting treatment with [place].  Vitamin D deficiency Chloe Byrd has a diagnosis of vitamin D deficiency. She is currently taking prescription vit D and last level was not yet at goal. She denies nausea, vomiting or muscle weakness. Chloe Byrd is due for labs.   ALLERGIES: Allergies  Allergen Reactions  . Hydrocodone     MEDICATIONS: Current Outpatient Prescriptions on File Prior to Visit  Medication Sig Dispense Refill  . atorvastatin (LIPITOR) 20 MG tablet Take 1 tablet (20 mg total) by mouth daily. 90 tablet 3  . buPROPion (WELLBUTRIN SR) 150 MG 12 hr tablet Take 2 tablets (300 mg total) by mouth every morning. 18 tablet 0  . doxycycline (VIBRA-TABS) 100 MG tablet Take 1 tablet (100 mg total) by mouth daily. 90 tablet 1  . levothyroxine (SYNTHROID, LEVOTHROID) 200 MCG tablet Take 1 tablet (200 mcg total) by mouth daily before breakfast. 90 tablet 3  . Liraglutide -Weight Management (SAXENDA) 18 MG/3ML SOPN Inject 3 mg into the skin every morning. 3 pen 0  . montelukast (SINGULAIR) 10 MG tablet Take 1 tablet (10 mg total) by mouth at bedtime. 90 tablet 3  . norgestrel-ethinyl estradiol (LO/OVRAL,CRYSELLE) 0.3-30 MG-MCG tablet Take 1 tablet by mouth daily. 3 Package 3  . Vitamin D, Ergocalciferol, (DRISDOL) 50000 units CAPS capsule TAKE 1 CAPSULE BY MOUTH EVERY 7 DAYS 12 capsule 2   No current facility-administered  medications on file prior to visit.     PAST MEDICAL HISTORY: Past Medical History:  Diagnosis Date  . Allergic rhinitis   . Dysmenorrhea   . Hyperlipidemia   . Hypothyroidism   . Joint pain   . Obesity   . Plantar fasciitis   . Tendonitis     PAST SURGICAL HISTORY: Past Surgical History:  Procedure Laterality Date  . CESAREAN SECTION  2008  . WISDOM TOOTH EXTRACTION     age 31    SOCIAL HISTORY: Social History  Substance Use Topics  . Smoking status: Never Smoker  . Smokeless tobacco: Never Used  . Alcohol use No    FAMILY HISTORY: Family History  Problem Relation Age of Onset  . Arthritis Mother   . Hypertension Mother   . Anxiety disorder Mother   . Sleep apnea Mother   . Diabetes Father   . Alcohol abuse Father   . Depression Father   . Hyperlipidemia Father     ROS: Review of Systems  Constitutional: Positive for weight loss.  Gastrointestinal: Negative for nausea and vomiting.  Musculoskeletal:       Negative muscle weakness    PHYSICAL EXAM: Blood pressure 105/67, pulse 62, temperature 98.4 F (36.9 C), temperature source Oral, height  (1.6 m), weight 235 lb (106.6 kg), last menstrual period 11/21/2016, SpO2 98 %. Body mass index is 41.63 kg/m. Physical Exam  Constitutional: She  is oriented to person, place, and time. She appears well-developed and well-nourished.  Cardiovascular: Normal rate.   Pulmonary/Chest: Effort normal.  Musculoskeletal: Normal range of motion.  Neurological: She is oriented to person, place, and time.  Skin: Skin is warm and dry.  Psychiatric: She has a normal mood and affect. Her behavior is normal.  Vitals reviewed.   RECENT LABS AND TESTS: BMET    Component Value Date/Time   NA 140 09/12/2016 0810   K 4.4 09/12/2016 0810   CL 105 09/12/2016 0810   CO2 20 09/12/2016 0810   GLUCOSE 89 09/12/2016 0810   BUN 10 09/12/2016 0810   CREATININE 0.77 09/12/2016 0810   CALCIUM 9.0 09/12/2016 0810   GFRNONAA  93 09/12/2016 0810   GFRAA 107 09/12/2016 0810   Lab Results  Component Value Date   HGBA1C 5.5 09/12/2016   HGBA1C 5.8 (H) 03/26/2016   Lab Results  Component Value Date   INSULIN 19.6 09/12/2016   INSULIN 23.4 03/26/2016   CBC    Component Value Date/Time   WBC 10.7 03/26/2016 1136   RBC 4.33 03/26/2016 1136   HGB 12.6 03/26/2016 1136   HCT 37.9 03/26/2016 1136   PLT 429 (H) 03/26/2016 1136   MCV 88 03/26/2016 1136   MCH 29.1 03/26/2016 1136   MCHC 33.2 03/26/2016 1136   RDW 14.3 03/26/2016 1136   LYMPHSABS 3.0 03/26/2016 1136   EOSABS 0.2 03/26/2016 1136   BASOSABS 0.0 03/26/2016 1136   Iron/TIBC/Ferritin/ %Sat No results found for: IRON, TIBC, FERRITIN, IRONPCTSAT Lipid Panel     Component Value Date/Time   CHOL 144 09/12/2016 0810   TRIG 115 09/12/2016 0810   HDL 42 09/12/2016 0810   CHOLHDL 5.4 (H) 02/08/2016 1026   LDLCALC 79 09/12/2016 0810   Hepatic Function Panel     Component Value Date/Time   PROT 6.7 09/12/2016 0810   ALBUMIN 4.2 09/12/2016 0810   AST 13 09/12/2016 0810   ALT 15 09/12/2016 0810   ALKPHOS 75 09/12/2016 0810   BILITOT <0.2 09/12/2016 0810      Component Value Date/Time   TSH 2.110 03/26/2016 1136   TSH 1.650 02/08/2016 1026    ASSESSMENT AND PLAN: Vitamin D deficiency  Class 3 obesity with serious comorbidity and body mass index (BMI) of 40.0 to 44.9 in adult, unspecified obesity type (HCC)  PLAN:  Vitamin D Deficiency Chloe Byrd was informed that low vitamin D levels contributes to fatigue and are associated with obesity, breast, and colon cancer. She agrees to continue to take prescription Vit D ,000 IU every week and we will check labs in 3 weeks and will follow up for routine testing of vitamin D, at least 2-3 times per year. She was informed of the risk of over-replacement of vitamin D and agrees to not increase her dose unless he discusses this with [place] first. Chloe Byrd agrees to follow up with our clinic in 3 weeks.  We  spent > than 50% of the 15 minute visit on the counseling as documented in the note.  Obesity Chloe Byrd is currently in the action stage of change. As such, her goal is to continue with weight loss efforts She has agreed to follow the Category 2 plan Chloe Byrd has been instructed to work up to a goal of 150 minutes of combined cardio and strengthening exercise per week for weight loss and overall health benefits. We discussed the following Behavioral Modification Strategies today: no skipping meals, increasing lean protein intake, decreasing simple carbohydrates  and work on meal planning and easy cooking plans  Chloe Byrd has agreed to follow up with our clinic in 3 weeks. She was informed of the importance of frequent follow up visits to maximize her success with intensive lifestyle modifications for her multiple health conditions.  I, [place] Crane, am acting as transcriptionist for Quillian Quince, MD  I have reviewed the above documentation for accuracy and completeness, and I agree with the above. -Quillian Quince, MD    OBESITY BEHAVIORAL INTERVENTION VISIT  Today's visit was # 13 out of 22.  Starting weight: 254 lbs Starting date: 03/26/16 Today's weight : 235 lbs Today's date: 12/12/2016 Total lbs lost to date: 48 (Patients must lose 7 lbs in the first 6 months to continue with counseling)   ASK: We discussed the diagnosis of obesity with Chloe Byrd today and Chloe Byrd agreed to give [place] permission to discuss obesity behavioral modification therapy today.  ASSESS: Chloe Byrd has the diagnosis of obesity and her BMI today is 41.64 Chloe Byrd is in the action stage of change   ADVISE: Chloe Byrd was educated on the multiple health risks of obesity as well as the benefit of weight loss to improve her health. She was advised of the need for long term treatment and the importance of lifestyle modifications.  AGREE: Multiple dietary modification options and treatment options were discussed and  Chloe Byrd agreed  to follow the Category 2 plan We discussed the following Behavioral Modification Strategies today: no skipping meals, increasing lean protein intake, decreasing simple carbohydrates and work on meal planning and easy cooking plans

## 2016-12-13 ENCOUNTER — Other Ambulatory Visit: Payer: 59

## 2016-12-13 ENCOUNTER — Other Ambulatory Visit: Payer: Self-pay | Admitting: Family

## 2016-12-13 DIAGNOSIS — R300 Dysuria: Secondary | ICD-10-CM

## 2016-12-13 LAB — URINALYSIS, COMPLETE
Bilirubin, UA: NEGATIVE
Nitrite, UA: POSITIVE — AB
Specific Gravity, UA: 1.02 (ref 1.005–1.030)
Urobilinogen, Ur: 4 mg/dL — ABNORMAL HIGH (ref 0.2–1.0)
pH, UA: 5 (ref 5.0–7.5)

## 2016-12-13 LAB — MICROSCOPIC EXAMINATION: RENAL EPITHEL UA: NONE SEEN /HPF

## 2016-12-13 MED ORDER — CIPROFLOXACIN HCL 500 MG PO TABS: 500.0000 mg | ORAL_TABLET | Freq: Two times a day (BID) | ORAL | 0 refills | Status: DC

## 2016-12-13 NOTE — Progress Notes (Signed)
Urine positive for UTI

## 2016-12-15 LAB — URINE CULTURE: ORGANISM ID, BACTERIA: NO GROWTH

## 2016-12-27 ENCOUNTER — Other Ambulatory Visit (INDEPENDENT_AMBULATORY_CARE_PROVIDER_SITE_OTHER): Payer: Self-pay | Admitting: Family Medicine

## 2017-01-01 MED FILL — BUPROPION SR 150 MG TABLET: 150 | 90 days supply | Qty: 180 | Fill #1

## 2017-01-02 ENCOUNTER — Encounter (INDEPENDENT_AMBULATORY_CARE_PROVIDER_SITE_OTHER): Payer: Self-pay

## 2017-01-02 MED FILL — SAXENDA 18 MG/3 ML PEN: 18 | 30 days supply | Qty: 15 | Fill #0

## 2017-01-09 ENCOUNTER — Ambulatory Visit (INDEPENDENT_AMBULATORY_CARE_PROVIDER_SITE_OTHER): Payer: 59 | Admitting: Physician Assistant

## 2017-01-09 VITALS — BP 130/81 | HR 70 | Temp 98.1°F | Ht 63.0 in | Wt 242.0 lb

## 2017-01-09 DIAGNOSIS — E7849 Other hyperlipidemia: Secondary | ICD-10-CM | POA: Diagnosis not present

## 2017-01-09 DIAGNOSIS — Z9189 Other specified personal risk factors, not elsewhere classified: Secondary | ICD-10-CM

## 2017-01-09 DIAGNOSIS — E038 Other specified hypothyroidism: Secondary | ICD-10-CM | POA: Diagnosis not present

## 2017-01-09 DIAGNOSIS — E66813 Obesity, class 3: Secondary | ICD-10-CM

## 2017-01-09 DIAGNOSIS — E8881 Metabolic syndrome: Secondary | ICD-10-CM

## 2017-01-09 DIAGNOSIS — Z6841 Body Mass Index (BMI) 40.0 and over, adult: Secondary | ICD-10-CM

## 2017-01-09 DIAGNOSIS — E559 Vitamin D deficiency, unspecified: Secondary | ICD-10-CM

## 2017-01-09 DIAGNOSIS — E88819 Insulin resistance, unspecified: Secondary | ICD-10-CM
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
## 2017-01-10 LAB — LIPID PANEL WITH LDL/HDL RATIO
CHOLESTEROL TOTAL: 207 mg/dL — AB (ref 100–199)
HDL: 48 mg/dL (ref >39)
LDL CALC: 127 mg/dL — AB (ref 0–99)
LDl/HDL Ratio: 2.6 ratio (ref 0.0–3.2)
Triglycerides: 159 mg/dL — ABNORMAL HIGH (ref 0–149)
VLDL CHOLESTEROL CAL: 32 mg/dL (ref 5–40)

## 2017-01-10 LAB — COMPREHENSIVE METABOLIC PANEL
ALBUMIN: 4.4 g/dL (ref 3.5–5.5)
ALT: 15 IU/L (ref 0–32)
AST: 16 IU/L (ref 0–40)
Albumin/Globulin Ratio: 1.8 (ref 1.2–2.2)
Alkaline Phosphatase: 78 IU/L (ref 39–117)
BUN / CREAT RATIO: 12 (ref 9–23)
BUN: 10 mg/dL (ref 6–24)
Bilirubin Total: 0.2 mg/dL (ref 0.0–1.2)
CALCIUM: 9.2 mg/dL (ref 8.7–10.2)
CO2: 19 mmol/L — AB (ref 20–29)
Chloride: 103 mmol/L (ref 96–106)
Creatinine, Ser: 0.84 mg/dL (ref 0.57–1.00)
GFR, EST AFRICAN AMERICAN: 96 mL/min/{1.73_m2} (ref >59)
GFR, EST NON AFRICAN AMERICAN: 84 mL/min/{1.73_m2} (ref >59)
GLUCOSE: 80 mg/dL (ref 65–99)
Globulin, Total: 2.5 g/dL (ref 1.5–4.5)
Potassium: 4.7 mmol/L (ref 3.5–5.2)
Sodium: 140 mmol/L (ref 134–144)
TOTAL PROTEIN: 6.9 g/dL (ref 6.0–8.5)

## 2017-01-10 LAB — HEMOGLOBIN A1C
Est. average glucose Bld gHb Est-mCnc: 117 mg/dL
Hgb A1c MFr Bld: 5.7 % — ABNORMAL HIGH (ref 4.8–5.6)

## 2017-01-10 LAB — T4, FREE: Free T4: 1.58 ng/dL (ref 0.82–1.77)

## 2017-01-10 LAB — INSULIN, RANDOM: INSULIN: 19.2 u[IU]/mL (ref 2.6–24.9)

## 2017-01-10 LAB — TSH: TSH: 2.75 u[IU]/mL (ref 0.450–4.500)

## 2017-01-10 LAB — T3: T3, Total: 131 ng/dL (ref 71–180)

## 2017-01-10 LAB — VITAMIN D 25 HYDROXY (VIT D DEFICIENCY, FRACTURES): Vit D, 25-Hydroxy: 34.9 ng/mL (ref 30.0–100.0)

## 2017-01-21 ENCOUNTER — Other Ambulatory Visit: Payer: Self-pay | Admitting: *Deleted

## 2017-01-21 MED ORDER — NORGESTREL-ETHINYL ESTRADIOL 0.3-30 MG-MCG PO TABS: 1.0000 | ORAL_TABLET | Freq: Every day | ORAL | 3 refills | Status: DC

## 2017-01-23 ENCOUNTER — Encounter (INDEPENDENT_AMBULATORY_CARE_PROVIDER_SITE_OTHER): Payer: Self-pay

## 2017-01-23 ENCOUNTER — Ambulatory Visit (INDEPENDENT_AMBULATORY_CARE_PROVIDER_SITE_OTHER): Payer: 59 | Admitting: Physician Assistant

## 2017-01-24 MED FILL — ELINEST-28 TABLET: 0.3-30 | 84 days supply | Qty: 84 | Fill #0

## 2017-01-27 MED FILL — DOXYCYCLINE HYCLATE 100 MG: 100 | 90 days supply | Qty: 90 | Fill #1

## 2017-02-10 ENCOUNTER — Other Ambulatory Visit (INDEPENDENT_AMBULATORY_CARE_PROVIDER_SITE_OTHER): Payer: Self-pay | Admitting: Family Medicine

## 2017-02-12 ENCOUNTER — Ambulatory Visit (INDEPENDENT_AMBULATORY_CARE_PROVIDER_SITE_OTHER): Payer: 59 | Admitting: Physician Assistant

## 2017-02-12 VITALS — BP 114/78 | HR 74 | Temp 98.2°F | Ht 63.0 in | Wt 241.0 lb

## 2017-02-12 DIAGNOSIS — Z9189 Other specified personal risk factors, not elsewhere classified: Secondary | ICD-10-CM

## 2017-02-12 DIAGNOSIS — E559 Vitamin D deficiency, unspecified: Secondary | ICD-10-CM

## 2017-02-12 DIAGNOSIS — E7849 Other hyperlipidemia: Secondary | ICD-10-CM

## 2017-02-12 DIAGNOSIS — Z6841 Body Mass Index (BMI) 40.0 and over, adult: Secondary | ICD-10-CM

## 2017-02-12 MED ORDER — LIRAGLUTIDE -WEIGHT MANAGEMENT 18 MG/3ML ~~LOC~~ SOPN: 3.0000 mg | PEN_INJECTOR | Freq: Every morning | SUBCUTANEOUS | 0 refills | Status: DC

## 2017-02-12 MED FILL — SAXENDA 18 MG/3 ML PEN: 18 | 30 days supply | Qty: 15 | Fill #0

## 2017-02-12 NOTE — Progress Notes (Signed)
Office: [phone]  /  Fax: [phone]   HPI:   Chief Complaint: OBESITY Chloe Byrd is here to discuss her progress with her obesity treatment plan. She is on the keep a food journal with 1200-1500 calories and 80+ grams of protein daily and is following her eating plan approximately 50 % of the time. She states she is walking for 30 minutes 2 times per week. Maylea continues to do well with weight loss. She would like to incorporate variety to her meals. She wants holiday eating strategies.   Her weight is 241 lb (109.3 kg) today and has had a weight loss of 1 pound over a period of 5 weeks since her last visit. She has lost 13 lbs since starting treatment with [place].  Hyperlipidemia Chloe Byrd has hyperlipidemia and has been trying to improve her cholesterol levels with intensive lifestyle modification including a low saturated fat diet, exercise and weight loss. She denies any chest pain, claudication or myalgias.  At risk for cardiovascular disease Chloe Byrd is at a higher than average risk for cardiovascular disease due to obesity and hyperlipidemia. She currently denies any chest pain.  Vitamin D deficiency Chloe Byrd has a diagnosis of vitamin D deficiency. She is currently taking prescription Vit D and denies nausea, vomiting or muscle weakness.  ALLERGIES: Allergies  Allergen Reactions  . Hydrocodone     MEDICATIONS: Current Outpatient Medications on File Prior to Visit  Medication Sig Dispense Refill  . atorvastatin (LIPITOR) 20 MG tablet Take 1 tablet (20 mg total) by mouth daily. 90 tablet 3  . buPROPion (WELLBUTRIN SR) 150 MG 12 hr tablet Take 2 tablets (300 mg total) by mouth every morning. 18 tablet 0  . doxycycline (VIBRA-TABS) 100 MG tablet Take 1 tablet (100 mg total) by mouth daily. 90 tablet 1  . levothyroxine (SYNTHROID, LEVOTHROID) 200 MCG tablet Take 1 tablet (200 mcg total) by mouth daily before breakfast. 90 tablet 3  . montelukast (SINGULAIR) 10 MG tablet Take 1 tablet (10  mg total) by mouth at bedtime. 90 tablet 3  . norgestrel-ethinyl estradiol (LO/OVRAL,CRYSELLE) 0.3-30 MG-MCG tablet Take 1 tablet by mouth daily. 3 Package 3  . Vitamin D, Ergocalciferol, (DRISDOL) 50000 units CAPS capsule TAKE 1 CAPSULE BY MOUTH EVERY 7 DAYS 12 capsule 2   No current facility-administered medications on file prior to visit.     PAST MEDICAL HISTORY: Past Medical History:  Diagnosis Date  . Allergic rhinitis   . Dysmenorrhea   . Hyperlipidemia   . Hypothyroidism   . Joint pain   . Obesity   . Plantar fasciitis   . Tendonitis     PAST SURGICAL HISTORY: Past Surgical History:  Procedure Laterality Date  . CESAREAN SECTION  2008  . WISDOM TOOTH EXTRACTION     age 32    SOCIAL HISTORY: Social History   Tobacco Use  . Smoking status: Never Smoker  . Smokeless tobacco: Never Used  Substance Use Topics  . Alcohol use: No  . Drug use: No    FAMILY HISTORY: Family History  Problem Relation Age of Onset  . Arthritis Mother   . Hypertension Mother   . Anxiety disorder Mother   . Sleep apnea Mother   . Diabetes Father   . Alcohol abuse Father   . Depression Father   . Hyperlipidemia Father     ROS: Review of Systems  Constitutional: Positive for weight loss.  Cardiovascular: Negative for chest pain and claudication.  Gastrointestinal: Negative for nausea and vomiting.  Musculoskeletal: Negative for myalgias.       Negative muscle weakness     PHYSICAL EXAM: Blood pressure 114/78, pulse 74, temperature 98.2 F (36.8 C), temperature source Oral, height 5\' 3"  (1.6 m), weight 241 lb (109.3 kg), SpO2 98 %. Body mass index is 42.69 kg/m. Physical Exam  Constitutional: She is oriented to person, place, and time. She appears well-developed and well-nourished.  Cardiovascular: Normal rate.  Pulmonary/Chest: Effort normal.  Musculoskeletal: Normal range of motion.  Neurological: She is oriented to person, place, and time.  Skin: Skin is warm and  dry.  Psychiatric: She has a normal mood and affect. Her behavior is normal.  Vitals reviewed.   RECENT LABS AND TESTS: BMET    Component Value Date/Time   NA 140 01/09/2017 1009   K 4.7 01/09/2017 1009   CL 103 01/09/2017 1009   CO2 19 (L) 01/09/2017 1009   GLUCOSE 80 01/09/2017 1009   BUN 10 01/09/2017 1009   CREATININE 0.84 01/09/2017 1009   CALCIUM 9.2 01/09/2017 1009   GFRNONAA 84 01/09/2017 1009   GFRAA 96 01/09/2017 1009   Lab Results  Component Value Date   HGBA1C 5.7 (H) 01/09/2017   HGBA1C 5.5 09/12/2016   HGBA1C 5.8 (H) 03/26/2016   Lab Results  Component Value Date   INSULIN 19.2 01/09/2017   INSULIN 19.6 09/12/2016   INSULIN 23.4 03/26/2016   CBC    Component Value Date/Time   WBC 10.7 03/26/2016 1136   RBC 4.33 03/26/2016 1136   HGB 12.6 03/26/2016 1136   HCT 37.9 03/26/2016 1136   PLT 429 (H) 03/26/2016 1136   MCV 88 03/26/2016 1136   MCH 29.1 03/26/2016 1136   MCHC 33.2 03/26/2016 1136   RDW 14.3 03/26/2016 1136   LYMPHSABS 3.0 03/26/2016 1136   EOSABS 0.2 03/26/2016 1136   BASOSABS 0.0 03/26/2016 1136   Iron/TIBC/Ferritin/ %Sat No results found for: IRON, TIBC, FERRITIN, IRONPCTSAT Lipid Panel     Component Value Date/Time   CHOL 207 (H) 01/09/2017 0955   TRIG 159 (H) 01/09/2017 0955   HDL 48 01/09/2017 0955   CHOLHDL 5.4 (H) 02/08/2016 1026   LDLCALC 127 (H) 01/09/2017 0955   Hepatic Function Panel     Component Value Date/Time   PROT 6.9 01/09/2017 1009   ALBUMIN 4.4 01/09/2017 1009   AST 16 01/09/2017 1009   ALT 15 01/09/2017 1009   ALKPHOS 78 01/09/2017 1009   BILITOT 0.2 01/09/2017 1009      Component Value Date/Time   TSH 2.750 01/09/2017 0955   TSH 2.110 03/26/2016 1136   TSH 1.650 02/08/2016 1026    ASSESSMENT AND PLAN: Other hyperlipidemia  Vitamin D deficiency  At risk for heart disease  Class 3 severe obesity with serious comorbidity and body mass index (BMI) of 40.0 to 44.9 in adult, unspecified obesity  type (HCC) - Plan: Liraglutide -Weight Management (SAXENDA) 18 MG/3ML SOPN  PLAN:  Hyperlipidemia Chloe Byrd was informed of the American Heart Association Guidelines emphasizing intensive lifestyle modifications as the first line treatment for hyperlipidemia. We discussed many lifestyle modifications today in depth, and Chloe Byrd will continue to work on decreasing saturated fats such as fatty red meat, butter and many fried foods. She will also increase vegetables and lean protein in her diet and continue to work on exercise and weight loss efforts. Chloe Byrd agrees to continues her medications as prescribed and she will follow up with our clinic in 2 weeks.  Cardiovascular risk counselling Chloe Byrd was given extended (  15 minutes) coronary artery disease prevention counseling today. She is 46 y.o. female and has risk factors for heart disease including obesity and hyperlipidemia. We discussed intensive lifestyle modifications today with an emphasis on specific weight loss instructions and strategies. Pt was also informed of the importance of increasing exercise and decreasing saturated fats to help prevent heart disease.  Vitamin D Deficiency Chloe Byrd was informed that low vitamin D levels contributes to fatigue and are associated with obesity, breast, and colon cancer. Chloe Byrd agrees to continue taking prescription Vit D @50 ,000 IU every week #4 and will follow up for routine testing of vitamin D, at least 2-3 times per year. She was informed of the risk of over-replacement of vitamin D and agrees to not increase her dose unless he discusses this with us first. Chloe Byrd agrees to follow up with our clinic in 2 weeks.  Obesity Chloe Byrd is currently in the action stage of change. As such, her goal is to continue with weight loss efforts She has agreed to keep a food journal with 1200-1500 calories and 80+ grams of protein daily Chloe Byrd has been instructed to work up to a goal of 150 minutes of combined cardio and strengthening  exercise per week for weight loss and overall health benefits. We discussed the following Behavioral Modification Strategies today: increasing lean protein intake and work on meal planning and easy cooking plans  Chloe Byrd agrees to continue Saxenda 5 pens (1.8 mg) and we will refill for 1 month.   Chloe Byrd has agreed to follow up with our clinic in 2 weeks. She was informed of the importance of frequent follow up visits to maximize her success with intensive lifestyle modifications for her multiple health conditions.  I, Burt KnackSharon Martin, am acting as transcriptionist for Illa LevelSahar Osman, PA-C  I have reviewed the above documentation for accuracy and completeness, and I agree with the above. -Illa LevelSahar Osman, PA-C  I have reviewed the above note and agree with the plan. -Quillian Quincearen Beasley, MD     Today's visit was # 15 out of 22.  Starting weight: 254 lbs Starting date: 03/26/16 Today's weight : 241 lbs  Today's date: 02/12/2017 Total lbs lost to date: 13 (Patients must lose 7 lbs in the first 6 months to continue with counseling)   ASK: We discussed the diagnosis of obesity with Chloe Byrd today and Chloe Byrd agreed to give us permission to discuss obesity behavioral modification therapy today.  ASSESS: Chloe Byrd has the diagnosis of obesity and her BMI today is 42.7 Chloe Byrd is in the action stage of change   ADVISE: Chloe Byrd was educated on the multiple health risks of obesity as well as the benefit of weight loss to improve her health. She was advised of the need for long term treatment and the importance of lifestyle modifications.  AGREE: Multiple dietary modification options and treatment options were discussed and  Chloe Byrd agreed to keep a food journal with 1200-1500 calories and 80+ grams of protein daily We discussed the following Behavioral Modification Strategies today: increasing lean protein intake and work on meal planning and easy cooking plans

## 2017-03-06 ENCOUNTER — Ambulatory Visit (INDEPENDENT_AMBULATORY_CARE_PROVIDER_SITE_OTHER): Payer: 59 | Admitting: Physician Assistant

## 2017-03-06 ENCOUNTER — Other Ambulatory Visit: Payer: Self-pay | Admitting: Family

## 2017-03-06 VITALS — BP 108/73 | HR 62 | Temp 97.8°F | Ht 63.0 in | Wt 244.0 lb

## 2017-03-06 DIAGNOSIS — Z6841 Body Mass Index (BMI) 40.0 and over, adult: Secondary | ICD-10-CM

## 2017-03-06 DIAGNOSIS — E559 Vitamin D deficiency, unspecified: Secondary | ICD-10-CM | POA: Diagnosis not present

## 2017-03-06 MED ORDER — LIRAGLUTIDE -WEIGHT MANAGEMENT 18 MG/3ML ~~LOC~~ SOPN: 3.0000 mg | PEN_INJECTOR | Freq: Every morning | SUBCUTANEOUS | 0 refills | Status: DC

## 2017-03-06 MED FILL — VIT D2 1.25 MG (50,000 UNIT: 1.25 MG | 84 days supply | Qty: 12 | Fill #1

## 2017-03-06 MED FILL — MONTELUKAST SOD 10 MG TAB: 10 | 90 days supply | Qty: 90 | Fill #0
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
## 2017-03-08 MED FILL — SAXENDA 18 MG/3 ML PEN: 18 | 30 days supply | Qty: 15 | Fill #0

## 2017-03-26 ENCOUNTER — Other Ambulatory Visit: Payer: Self-pay | Admitting: *Deleted

## 2017-03-26 MED ORDER — ATORVASTATIN CALCIUM 20 MG PO TABS: 20.0000 mg | ORAL_TABLET | Freq: Every day | ORAL | 3 refills | Status: DC

## 2017-03-26 MED ORDER — LEVOTHYROXINE SODIUM 200 MCG PO TABS: 200.0000 ug | ORAL_TABLET | Freq: Every day | ORAL | 3 refills | Status: DC

## 2017-03-26 MED FILL — ATORVASTATIN 20 MG TABLET: 20 | 90 days supply | Qty: 90 | Fill #0

## 2017-03-26 MED FILL — LEVOTHYROXINE 200 MCG TAB: 200 | 90 days supply | Qty: 90 | Fill #0

## 2017-04-03 ENCOUNTER — Ambulatory Visit (INDEPENDENT_AMBULATORY_CARE_PROVIDER_SITE_OTHER): Payer: 59 | Admitting: Physician Assistant

## 2017-04-03 VITALS — BP 111/72 | HR 74 | Temp 98.1°F | Ht 63.0 in | Wt 244.0 lb

## 2017-04-03 DIAGNOSIS — Z6841 Body Mass Index (BMI) 40.0 and over, adult: Secondary | ICD-10-CM | POA: Diagnosis not present

## 2017-04-03 DIAGNOSIS — E559 Vitamin D deficiency, unspecified: Secondary | ICD-10-CM | POA: Diagnosis not present

## 2017-04-03 MED FILL — BUPROPION SR 150 MG TABLET: 150 | 90 days supply | Qty: 180 | Fill #2

## 2017-04-03 NOTE — Progress Notes (Addendum)
Office: [phone]  /  Fax: [phone]   HPI:   Chief Complaint: OBESITY Chloe Byrd is here to discuss her progress with her obesity treatment plan. She is on the keep a food journal with 1200 to 1500 calories and 80+ grams of protein daily and is following her eating plan approximately 70 % of the time. She states she is walking for 30 minutes 3 times per week. Chloe Byrd maintained her weight over the holidays. She would like more high protein recipes. Her weight is 244 lb (110.7 kg) today and has maintained weight over a period of 4 weeks since her last visit. She has lost 10 lbs since starting treatment with Chloe Byrd.  Vitamin D deficiency Chloe Byrd has a diagnosis of vitamin D deficiency. She is currently taking vit D and denies nausea, vomiting or muscle weakness.   Ref. Range 01/09/2017 09:55  Vitamin D, 25-Hydroxy Latest Ref Range: 30.0 - 100.0 ng/mL 34.9    ALLERGIES: Allergies  Allergen Reactions  . Hydrocodone     MEDICATIONS: Current Outpatient Medications on File Prior to Visit  Medication Sig Dispense Refill  . atorvastatin (LIPITOR) 20 MG tablet Take 1 tablet (20 mg total) by mouth daily. 90 tablet 3  . buPROPion (WELLBUTRIN SR) 150 MG 12 hr tablet Take 2 tablets (300 mg total) by mouth every morning. 18 tablet 0  . doxycycline (VIBRA-TABS) 100 MG tablet Take 1 tablet (100 mg total) by mouth daily. 90 tablet 1  . levothyroxine (SYNTHROID, LEVOTHROID) 200 MCG tablet Take 1 tablet (200 mcg total) by mouth daily before breakfast. 90 tablet 3  . Liraglutide -Weight Management (SAXENDA) 18 MG/3ML SOPN Inject 3 mg into the skin every morning. 5 pen 0  . montelukast (SINGULAIR) 10 MG tablet TAKE 1 TABLET BY MOUTH AT BEDTIME. 90 tablet 1  . norgestrel-ethinyl estradiol (LO/OVRAL,CRYSELLE) 0.3-30 MG-MCG tablet Take 1 tablet by mouth daily. 3 Package 3  . Vitamin D, Ergocalciferol, (DRISDOL) 50000 units CAPS capsule TAKE 1 CAPSULE BY MOUTH EVERY 7 DAYS 12 capsule 2   No current  facility-administered medications on file prior to visit.     PAST MEDICAL HISTORY: Past Medical History:  Diagnosis Date  . Allergic rhinitis   . Dysmenorrhea   . Hyperlipidemia   . Hypothyroidism   . Joint pain   . Obesity   . Plantar fasciitis   . Tendonitis     PAST SURGICAL HISTORY: Past Surgical History:  Procedure Laterality Date  . CESAREAN SECTION  2008  . WISDOM TOOTH EXTRACTION     age 47    SOCIAL HISTORY: Social History   Tobacco Use  . Smoking status: Never Smoker  . Smokeless tobacco: Never Used  Substance Use Topics  . Alcohol use: No  . Drug use: No    FAMILY HISTORY: Family History  Problem Relation Age of Onset  . Arthritis Mother   . Hypertension Mother   . Anxiety disorder Mother   . Sleep apnea Mother   . Diabetes Father   . Alcohol abuse Father   . Depression Father   . Hyperlipidemia Father     ROS: Review of Systems  Constitutional: Negative for weight loss.  Gastrointestinal: Negative for nausea and vomiting.  Musculoskeletal:       Negative muscle weakness    PHYSICAL EXAM: Blood pressure 111/72, pulse 74, temperature 98.1 F (36.7 C), temperature source Oral, height 5\' 3"  (1.6 m), weight 244 lb (110.7 kg), SpO2 98 %. Body mass index is 43.22 kg/m. Physical Exam  Constitutional: She is oriented to person, place, and time. She appears well-developed and well-nourished.  Cardiovascular: Normal rate.  Pulmonary/Chest: Effort normal.  Musculoskeletal: Normal range of motion.  Neurological: She is oriented to person, place, and time.  Skin: Skin is warm and dry.  Psychiatric: She has a normal mood and affect. Her behavior is normal.  Vitals reviewed.   RECENT LABS AND TESTS: BMET    Component Value Date/Time   NA 140 01/09/2017 1009   K 4.7 01/09/2017 1009   CL 103 01/09/2017 1009   CO2 19 (L) 01/09/2017 1009   GLUCOSE 80 01/09/2017 1009   BUN 10 01/09/2017 1009   CREATININE 0.84 01/09/2017 1009   CALCIUM 9.2  01/09/2017 1009   GFRNONAA 84 01/09/2017 1009   GFRAA 96 01/09/2017 1009   Lab Results  Component Value Date   HGBA1C 5.7 (H) 01/09/2017   HGBA1C 5.5 09/12/2016   HGBA1C 5.8 (H) 03/26/2016   Lab Results  Component Value Date   INSULIN 19.2 01/09/2017   INSULIN 19.6 09/12/2016   INSULIN 23.4 03/26/2016   CBC    Component Value Date/Time   WBC 10.7 03/26/2016 1136   RBC 4.33 03/26/2016 1136   HGB 12.6 03/26/2016 1136   HCT 37.9 03/26/2016 1136   PLT 429 (H) 03/26/2016 1136   MCV 88 03/26/2016 1136   MCH 29.1 03/26/2016 1136   MCHC 33.2 03/26/2016 1136   RDW 14.3 03/26/2016 1136   LYMPHSABS 3.0 03/26/2016 1136   EOSABS 0.2 03/26/2016 1136   BASOSABS 0.0 03/26/2016 1136   Iron/TIBC/Ferritin/ %Sat No results found for: IRON, TIBC, FERRITIN, IRONPCTSAT Lipid Panel     Component Value Date/Time   CHOL 207 (H) 01/09/2017 0955   TRIG 159 (H) 01/09/2017 0955   HDL 48 01/09/2017 0955   CHOLHDL 5.4 (H) 02/08/2016 1026   LDLCALC 127 (H) 01/09/2017 0955   Hepatic Function Panel     Component Value Date/Time   PROT 6.9 01/09/2017 1009   ALBUMIN 4.4 01/09/2017 1009   AST 16 01/09/2017 1009   ALT 15 01/09/2017 1009   ALKPHOS 78 01/09/2017 1009   BILITOT 0.2 01/09/2017 1009      Component Value Date/Time   TSH 2.750 01/09/2017 0955   TSH 2.110 03/26/2016 1136   TSH 1.650 02/08/2016 1026     Ref. Range 01/09/2017 09:55  Vitamin D, 25-Hydroxy Latest Ref Range: 30.0 - 100.0 ng/mL 34.9    ASSESSMENT AND PLAN: Vitamin D deficiency  Class 3 severe obesity with serious comorbidity and body mass index (BMI) of 40.0 to 44.9 in adult, unspecified obesity type (HCC) - Plan: Liraglutide -Weight Management (SAXENDA) 18 MG/3ML SOPN  PLAN:  Vitamin D Deficiency Chloe Byrd was informed that low vitamin D levels contributes to fatigue and are associated with obesity, breast, and colon cancer. She agrees to continue to take prescription Vit D @50 ,000 IU every week and will follow up  for routine testing of vitamin D, at least 2-3 times per year. She was informed of the risk of over-replacement of vitamin D and agrees to not increase her dose unless he discusses this with [place] first.  We spent > than 50% of the 15 minute visit on the counseling as documented in the note.  Obesity Chloe Byrd is currently in the action stage of change. As such, her goal is to continue with weight loss efforts She has agreed to keep a food journal with 1200 to 1500 calories and 80 grams of protein daily Chloe Byrd has been  instructed to work up to a goal of 150 minutes of combined cardio and strengthening exercise per week for weight loss and overall health benefits. We discussed the following Behavioral Modification Strategies today: increasing lean protein intake and planning for success. We discussed various medication options to help [facility] Chloe Byrd with her weight loss efforts and we both agreed to continue Saxenda and we will refill 5 pens.  Chloe Byrd has agreed to follow up with our clinic in 4 weeks. She was informed of the importance of frequent follow up visits to maximize her success with intensive lifestyle modifications for her multiple health conditions.  Chloe Byrd, am acting as transcriptionist for Illa Level, PA-C I Illa Level [facility] have reviewed this note and agree with its contents  OBESITY BEHAVIORAL INTERVENTION VISIT  Today's visit was # 17 out of 22.  Starting weight: 254 lbs Starting date: 03/26/16 Today's weight : 244 lbs  Today's date: 04/03/2017 Total lbs lost to date: 10 (Patients must lose 7 lbs in the first 6 months to continue with counseling)   ASK: We discussed the diagnosis of obesity with Chloe Byrd today and Chloe Kobus agreed to give [place] permission to discuss obesity behavioral modification therapy today.  ASSESS: Chloe Byrd has the diagnosis of obesity and her BMI today is 43.23 Chloe Byrd is in the action stage of change   ADVISE: Chloe Byrd was educated on the multiple health  risks of obesity as well as the benefit of weight loss to improve her health. She was advised of the need for long term treatment and the importance of lifestyle modifications.  AGREE: Multiple dietary modification options and treatment options were discussed and  Chloe Byrd agreed to the above obesity treatment plan.  I have reviewed the above documentation for accuracy and completeness, and I agree with the above. -Quillian Quince, MD

## 2017-04-09 ENCOUNTER — Other Ambulatory Visit (INDEPENDENT_AMBULATORY_CARE_PROVIDER_SITE_OTHER): Payer: Self-pay

## 2017-04-09 DIAGNOSIS — Z6841 Body Mass Index (BMI) 40.0 and over, adult: Principal | ICD-10-CM

## 2017-04-09 MED ORDER — LIRAGLUTIDE -WEIGHT MANAGEMENT 18 MG/3ML ~~LOC~~ SOPN: 3.0000 mg | PEN_INJECTOR | Freq: Every morning | SUBCUTANEOUS | 0 refills | Status: DC

## 2017-04-10 MED FILL — ELINEST-28 TABLET: 0.3-30 | 84 days supply | Qty: 84 | Fill #1

## 2017-04-11 MED FILL — SHIPPING COST: 1 days supply | Qty: 1 | Fill #0

## 2017-04-22 ENCOUNTER — Other Ambulatory Visit (INDEPENDENT_AMBULATORY_CARE_PROVIDER_SITE_OTHER): Payer: Self-pay

## 2017-04-22 ENCOUNTER — Encounter (INDEPENDENT_AMBULATORY_CARE_PROVIDER_SITE_OTHER): Payer: Self-pay | Admitting: Physician Assistant

## 2017-04-22 DIAGNOSIS — Z6841 Body Mass Index (BMI) 40.0 and over, adult: Principal | ICD-10-CM

## 2017-04-22 MED ORDER — LIRAGLUTIDE -WEIGHT MANAGEMENT 18 MG/3ML ~~LOC~~ SOPN: 3.0000 mg | PEN_INJECTOR | Freq: Every morning | SUBCUTANEOUS | 0 refills | Status: DC

## 2017-04-22 MED FILL — SAXENDA 18 MG/3 ML PEN: 18 | 30 days supply | Qty: 15 | Fill #0

## 2017-04-22 MED FILL — SHIPPING COST: 1 days supply | Qty: 1 | Fill #1

## 2017-04-22 NOTE — Telephone Encounter (Signed)
Please send 5 pens to [facility]. Thanks

## 2017-05-01 ENCOUNTER — Ambulatory Visit (INDEPENDENT_AMBULATORY_CARE_PROVIDER_SITE_OTHER): Payer: 59 | Admitting: Family Medicine

## 2017-05-01 VITALS — BP 113/74 | HR 66 | Temp 98.1°F | Ht 63.0 in | Wt 245.0 lb

## 2017-05-01 DIAGNOSIS — Z6841 Body Mass Index (BMI) 40.0 and over, adult: Secondary | ICD-10-CM | POA: Diagnosis not present

## 2017-05-01 DIAGNOSIS — E559 Vitamin D deficiency, unspecified: Secondary | ICD-10-CM | POA: Diagnosis not present

## 2017-05-01 DIAGNOSIS — E7849 Other hyperlipidemia: Secondary | ICD-10-CM

## 2017-05-01 DIAGNOSIS — F3289 Other specified depressive episodes: Secondary | ICD-10-CM

## 2017-05-01 NOTE — Progress Notes (Signed)
Office: [phone]  /  Fax: [phone]   HPI:   Chief Complaint: OBESITY Chloe Byrd is here to discuss her progress with her obesity treatment plan. She is on the keep a food journal with 1200-1500 calories and 80 grams of protein daily and is following her eating plan approximately 50 % of the time. She states she is walking for 30 minutes 2 times per week. Emanii has been doing really well until the past week and a half due to social issues. When she was journaling she was hitting protein and calories 75% of the time.  Her weight is 245 lb (111.1 kg) today and has gained 1 pound since her last visit. She has lost 9 lbs since starting treatment with [place].  Vitamin D deficiency Taiylor has a diagnosis of vitamin D deficiency. She is currently taking prescription Vit D. She notes fatigue and denies nausea, vomiting or muscle weakness.  Hyperlipidemia Sarika has hyperlipidemia and has been trying to improve her cholesterol levels with intensive lifestyle modification including a low saturated fat diet, exercise and weight loss. She is Lipitor with no myalgias.  Depression with emotional eating behaviors Ariyonna is struggling with emotional eating and using food for comfort to the extent that it is negatively impacting her health. She often snacks when she is not hungry. Lithzy sometimes feels she is out of control and then feels guilty that she made poor food choices. She has been working on behavior modification techniques to help reduce her emotional eating and has been somewhat successful. She denies insomnia and shows no sign of suicidal or homicidal ideations.  Depression screen [facility] 2/9 03/26/2016 02/08/2016  Decreased Interest 1 0  Down, Depressed, Hopeless 3 0  PHQ - 2 Score 4 0  Altered sleeping 3 -  Tired, decreased energy 3 -  Change in appetite 2 -  Feeling bad or failure about yourself  0 -  Trouble concentrating 0 -  Moving slowly or fidgety/restless 0 -  Suicidal thoughts 0 -  PHQ-9  Score 12 -   ALLERGIES: Allergies  Allergen Reactions  . Hydrocodone     MEDICATIONS: Current Outpatient Medications on File Prior to Visit  Medication Sig Dispense Refill  . atorvastatin (LIPITOR) 20 MG tablet Take 1 tablet (20 mg total) by mouth daily. 90 tablet 3  . buPROPion (WELLBUTRIN SR) 150 MG 12 hr tablet Take 2 tablets (300 mg total) by mouth every morning. 18 tablet 0  . doxycycline (VIBRA-TABS) 100 MG tablet Take 1 tablet (100 mg total) by mouth daily. 90 tablet 1  . levothyroxine (SYNTHROID, LEVOTHROID) 200 MCG tablet Take 1 tablet (200 mcg total) by mouth daily before breakfast. 90 tablet 3  . Liraglutide -Weight Management (SAXENDA) 18 MG/3ML SOPN Inject 3 mg into the skin every morning. 15 mL 0  . montelukast (SINGULAIR) 10 MG tablet TAKE 1 TABLET BY MOUTH AT BEDTIME. 90 tablet 1  . norgestrel-ethinyl estradiol (LO/OVRAL,CRYSELLE) 0.3-30 MG-MCG tablet Take 1 tablet by mouth daily. 3 Package 3  . Vitamin D, Ergocalciferol, (DRISDOL) 50000 units CAPS capsule TAKE 1 CAPSULE BY MOUTH EVERY 7 DAYS 12 capsule 2   No current facility-administered medications on file prior to visit.     PAST MEDICAL HISTORY: Past Medical History:  Diagnosis Date  . Allergic rhinitis   . Dysmenorrhea   . Hyperlipidemia   . Hypothyroidism   . Joint pain   . Obesity   . Plantar fasciitis   . Tendonitis     PAST SURGICAL  HISTORY: Past Surgical History:  Procedure Laterality Date  . CESAREAN SECTION  2008  . WISDOM TOOTH EXTRACTION     age 38    SOCIAL HISTORY: Social History   Tobacco Use  . Smoking status: Never Smoker  . Smokeless tobacco: Never Used  Substance Use Topics  . Alcohol use: No  . Drug use: No    FAMILY HISTORY: Family History  Problem Relation Age of Onset  . Arthritis Mother   . Hypertension Mother   . Anxiety disorder Mother   . Sleep apnea Mother   . Diabetes Father   . Alcohol abuse Father   . Depression Father   . Hyperlipidemia Father      ROS: Review of Systems  Constitutional: Positive for malaise/fatigue. Negative for weight loss.  Gastrointestinal: Negative for nausea and vomiting.  Musculoskeletal: Negative for myalgias.       Negative muscle weakness  Psychiatric/Behavioral: Positive for depression. Negative for suicidal ideas. The patient does not have insomnia.     PHYSICAL EXAM: Blood pressure 113/74, pulse 66, temperature 98.1 F (36.7 C), temperature source Oral, height 5\' 3"  (1.6 m), weight 245 lb (111.1 kg), SpO2 97 %. Body mass index is 43.4 kg/m. Physical Exam  Constitutional: She is oriented to person, place, and time. She appears well-developed and well-nourished.  Cardiovascular: Normal rate.  Pulmonary/Chest: Effort normal.  Musculoskeletal: Normal range of motion.  Neurological: She is oriented to person, place, and time.  Skin: Skin is warm and dry.  Psychiatric: She has a normal mood and affect. Her behavior is normal.  Vitals reviewed.   RECENT LABS AND TESTS: BMET    Component Value Date/Time   NA 140 01/09/2017 1009   K 4.7 01/09/2017 1009   CL 103 01/09/2017 1009   CO2 19 (L) 01/09/2017 1009   GLUCOSE 80 01/09/2017 1009   BUN 10 01/09/2017 1009   CREATININE 0.84 01/09/2017 1009   CALCIUM 9.2 01/09/2017 1009   GFRNONAA 84 01/09/2017 1009   GFRAA 96 01/09/2017 1009   Lab Results  Component Value Date   HGBA1C 5.7 (H) 01/09/2017   HGBA1C 5.5 09/12/2016   HGBA1C 5.8 (H) 03/26/2016   Lab Results  Component Value Date   INSULIN 19.2 01/09/2017   INSULIN 19.6 09/12/2016   INSULIN 23.4 03/26/2016   CBC    Component Value Date/Time   WBC 10.7 03/26/2016 1136   RBC 4.33 03/26/2016 1136   HGB 12.6 03/26/2016 1136   HCT 37.9 03/26/2016 1136   PLT 429 (H) 03/26/2016 1136   MCV 88 03/26/2016 1136   MCH 29.1 03/26/2016 1136   MCHC 33.2 03/26/2016 1136   RDW 14.3 03/26/2016 1136   LYMPHSABS 3.0 03/26/2016 1136   EOSABS 0.2 03/26/2016 1136   BASOSABS 0.0 03/26/2016 1136    Iron/TIBC/Ferritin/ %Sat No results found for: IRON, TIBC, FERRITIN, IRONPCTSAT Lipid Panel     Component Value Date/Time   CHOL 207 (H) 01/09/2017 0955   TRIG 159 (H) 01/09/2017 0955   HDL 48 01/09/2017 0955   CHOLHDL 5.4 (H) 02/08/2016 1026   LDLCALC 127 (H) 01/09/2017 0955   Hepatic Function Panel     Component Value Date/Time   PROT 6.9 01/09/2017 1009   ALBUMIN 4.4 01/09/2017 1009   AST 16 01/09/2017 1009   ALT 15 01/09/2017 1009   ALKPHOS 78 01/09/2017 1009   BILITOT 0.2 01/09/2017 1009      Component Value Date/Time   TSH 2.750 01/09/2017 0955   TSH 2.110 03/26/2016  1136   TSH 1.650 02/08/2016 1026  Results for Vertis KelchJONES, Laquiesha SMITH (MRN [id number]) as of 05/01/2017 10:12  Ref. Range 01/09/2017 09:55  Vitamin D, 25-Hydroxy Latest Ref Range: 30.0 - 100.0 ng/mL 34.9    ASSESSMENT AND PLAN: Vitamin D deficiency  Other hyperlipidemia  Other depression - with emotional eating  Class 3 severe obesity with serious comorbidity and body mass index (BMI) of 40.0 to 44.9 in adult, unspecified obesity type (HCC)  PLAN:  Vitamin D Deficiency Lawanna Kobusngel was informed that low vitamin D levels contributes to fatigue and are associated with obesity, breast, and colon cancer. Lawanna Kobusngel agrees to continue taking prescription Vit D @50 ,000 IU every week #4, fast for labs, and will follow up for routine testing of vitamin D, at least 2-3 times per year. She was informed of the risk of over-replacement of vitamin D and agrees to not increase her dose unless she discusses this with us first. Lawanna Kobusngel agrees to follow up with our clinic in 2 weeks.  Hyperlipidemia Lawanna Kobusngel was informed of the American Heart Association Guidelines emphasizing intensive lifestyle modifications as the first line treatment for hyperlipidemia. We discussed many lifestyle modifications today in depth, and Lawanna Kobusngel will continue to work on decreasing saturated fats such as fatty red meat, butter and many fried foods. She will also  increase vegetables and lean protein in her diet and continue to work on exercise and weight loss efforts. Lawanna Kobusngel will fast for labs at next visit and she agrees to continue taking Lipitor as prescribed. Lawanna Kobusngel agrees to follow up with our clinic in 2 weeks.  Depression with Emotional Eating Behaviors We discussed behavior modification techniques today to help Lawanna Kobusngel deal with her emotional eating and depression. Lawanna Kobusngel agrees to continue taking Wellbutrin SR 150 mg PO daily and she agrees to follow up with our clinic in 2 weeks.  We spent > than 50% of the 15 minute visit on the counseling as documented in the note.  Obesity Lawanna Kobusngel is currently in the action stage of change. As such, her goal is to continue with weight loss efforts She has agreed to keep a food journal with 1500 calories and 80 grams of protein daily Lawanna Kobusngel has been instructed to work up to a goal of 150 minutes of combined cardio and strengthening exercise per week for weight loss and overall health benefits. We discussed the following Behavioral Modification Strategies today: increasing lean protein intake, work on meal planning and easy cooking plans, and keep a strict food journal We discussed various medication options to help [place] with her weight loss efforts and we both agreed to continue Saxenda (no reported side effects of nausea, vomiting, or abdominal pain)  Lawanna Kobusngel has agreed to follow up with our clinic in 2 weeks. She was informed of the importance of frequent follow up visits to maximize her success with intensive lifestyle modifications for her multiple health conditions.   OBESITY BEHAVIORAL INTERVENTION VISIT  Today's visit was # 18 out of 22.  Starting weight: 254 lbs Starting date: 03/26/16 Today's weight : 245 lbs  Today's date: 05/01/2017 Total lbs lost to date: 9 (Patients must lose 7 lbs in the first 6 months to continue with counseling)   ASK: We discussed the diagnosis of obesity with Vertis KelchAngel Smith Okelley  today and Lawanna KobusAngel agreed to give us permission to discuss obesity behavioral modification therapy today.  ASSESS: Lawanna Kobusngel has the diagnosis of obesity and her BMI today is 43.41 Lawanna Kobusngel is in the action stage of change  ADVISE: Retha was educated on the multiple health risks of obesity as well as the benefit of weight loss to improve her health. She was advised of the need for long term treatment and the importance of lifestyle modifications.  AGREE: Multiple dietary modification options and treatment options were discussed and  Bryanna agreed to the above obesity treatment plan.  I, Burt Knack, am acting as transcriptionist for Debbra Riding, MD  I have reviewed the above documentation for accuracy and completeness, and I agree with the above. - Debbra Riding, MD

## 2017-05-05 ENCOUNTER — Ambulatory Visit (INDEPENDENT_AMBULATORY_CARE_PROVIDER_SITE_OTHER): Payer: 59 | Admitting: Family Medicine

## 2017-05-15 ENCOUNTER — Ambulatory Visit (INDEPENDENT_AMBULATORY_CARE_PROVIDER_SITE_OTHER): Payer: 59 | Admitting: Physician Assistant

## 2017-05-29 ENCOUNTER — Ambulatory Visit (INDEPENDENT_AMBULATORY_CARE_PROVIDER_SITE_OTHER): Payer: 59 | Admitting: Physician Assistant

## 2017-06-20 MED FILL — LEVOTHYROXINE 200 MCG TAB: 200 | 90 days supply | Qty: 90 | Fill #1

## 2017-06-20 MED FILL — VIT D2 1.25 MG (50,000 UNIT: 1.25 MG | 84 days supply | Qty: 12 | Fill #2

## 2017-07-03 ENCOUNTER — Other Ambulatory Visit: Payer: Self-pay | Admitting: Family

## 2017-07-03 MED FILL — ELINEST-28 TABLET: 0.3-30 | 84 days supply | Qty: 84 | Fill #2

## 2017-07-03 MED FILL — DOXYCYCLINE HYCLATE 100 MG: 100 | 90 days supply | Qty: 90 | Fill #0

## 2017-07-03 MED FILL — BUPROPION SR 150 MG TABLET: 150 | 90 days supply | Qty: 180 | Fill #3

## 2017-07-03 MED FILL — ATORVASTATIN 20 MG TABLET: 20 | 90 days supply | Qty: 90 | Fill #1

## 2017-07-03 NOTE — Telephone Encounter (Signed)
[facility]

## 2017-08-14 ENCOUNTER — Ambulatory Visit (INDEPENDENT_AMBULATORY_CARE_PROVIDER_SITE_OTHER): Payer: 59 | Admitting: Family

## 2017-08-14 ENCOUNTER — Encounter: Payer: Self-pay | Admitting: Family

## 2017-08-14 ENCOUNTER — Ambulatory Visit: Payer: 59 | Admitting: Family

## 2017-08-14 VITALS — BP 104/68 | HR 76 | Temp 98.5°F | Ht 63.0 in | Wt 245.0 lb

## 2017-08-14 DIAGNOSIS — E039 Hypothyroidism, unspecified: Secondary | ICD-10-CM

## 2017-08-14 DIAGNOSIS — Z0000 Encounter for general adult medical examination without abnormal findings: Secondary | ICD-10-CM

## 2017-08-14 DIAGNOSIS — E559 Vitamin D deficiency, unspecified: Secondary | ICD-10-CM

## 2017-08-14 DIAGNOSIS — Z23 Encounter for immunization: Secondary | ICD-10-CM | POA: Diagnosis not present

## 2017-08-14 DIAGNOSIS — J301 Allergic rhinitis due to pollen: Secondary | ICD-10-CM

## 2017-08-14 DIAGNOSIS — F3289 Other specified depressive episodes: Secondary | ICD-10-CM

## 2017-08-14 DIAGNOSIS — E7849 Other hyperlipidemia: Secondary | ICD-10-CM

## 2017-08-14 MED ORDER — SUVOREXANT 10 MG PO TABS: 10.0000 mg | ORAL_TABLET | Freq: Every day | ORAL | 1 refills | Status: DC

## 2017-08-14 NOTE — Addendum Note (Signed)
Addended by: Jannifer Rodney A on: 08/14/2017 01:50 PM   Modules accepted: Orders

## 2017-08-14 NOTE — Patient Instructions (Signed)

## 2017-08-14 NOTE — Progress Notes (Signed)
 Subjective:    Patient ID: Chloe Byrd, female    DOB: 05/20/1970, 47 y.o.   MRN: [id number]  Chief Complaint  Patient presents with  . Annual Exam   Pt presents to the office today for CPE without pap.  Hyperlipidemia  This is a chronic problem. The current episode started more than 1 year ago. The problem is controlled. Recent lipid tests were reviewed and are normal. Exacerbating diseases include obesity. Current antihyperlipidemic treatment includes statins. The current treatment provides moderate improvement of lipids. Risk factors for coronary artery disease include dyslipidemia, diabetes mellitus, hypertension, a sedentary lifestyle and obesity.  Depression         This is a chronic problem.  The current episode started more than 1 year ago.   The onset quality is gradual.   The problem occurs intermittently.  The problem has been waxing and waning since onset.  Associated symptoms include fatigue, insomnia, irritable, restlessness and sad.  Associated symptoms include no helplessness and no hopelessness.  Past treatments include SNRIs - Serotonin and norepinephrine reuptake inhibitors.  Past medical history includes thyroid problem.   Thyroid Problem  Presents for follow-up visit. Symptoms include fatigue. Patient reports no constipation, diarrhea or weight gain. The symptoms have been stable. Her past medical history is significant for hyperlipidemia.  Insomnia  Primary symptoms: difficulty falling asleep, premature morning awakening.  The current episode started more than one year. The onset quality is gradual. The problem occurs intermittently. The problem has been waxing and waning since onset. PMH includes: depression.      Review of Systems  Constitutional: Positive for fatigue. Negative for weight gain.  Gastrointestinal: Negative for constipation and diarrhea.  Psychiatric/Behavioral: Positive for depression. The patient has insomnia.   All other systems reviewed and  are negative.      Objective:   Physical Exam  Constitutional: She is oriented to person, place, and time. She appears well-developed and well-nourished. She is irritable. No distress.  HENT:  Head: Normocephalic and atraumatic.  Right Ear: External ear normal.  Left Ear: External ear normal.  Mouth/Throat: Oropharynx is clear and moist.  Eyes: Pupils are equal, round, and reactive to light.  Neck: Normal range of motion. Neck supple. No thyromegaly present.  Cardiovascular: Normal rate, regular rhythm, normal heart sounds and intact distal pulses.  No murmur heard. Pulmonary/Chest: Effort normal and breath sounds normal. No respiratory distress. She has no wheezes.  Abdominal: Soft. Bowel sounds are normal. She exhibits no distension. There is no tenderness.  Musculoskeletal: Normal range of motion. She exhibits edema (trace BLE). She exhibits no tenderness.  Neurological: She is alert and oriented to person, place, and time. She has normal reflexes. No cranial nerve deficit.  Skin: Skin is warm and dry.  Psychiatric: She has a normal mood and affect. Her behavior is normal. Judgment and thought content normal.  Vitals reviewed.     BP 104/68   Pulse 76   Temp 98.5 F (36.9 C) (Oral)   Ht 5' 3" (1.6 m)   BMI 43.40 kg/m      Assessment & Plan:  Chloe Byrd comes in today with chief complaint of Annual Exam   Diagnosis and orders addressed:  1. Hypothyroidism, unspecified type - CMP14+EGFR; Future - TSH; Future - CBC with Differential/Platelet; Future  2. Other hyperlipidemia - CMP14+EGFR; Future - CBC with Differential/Platelet; Future  3. Other depression - CMP14+EGFR; Future - CBC with Differential/Platelet; Future  4. Morbid obesity (HCC) - CMP14+EGFR; Future -   CBC with Differential/Platelet; Future  5. Vitamin D deficiency - CMP14+EGFR; Future - VITAMIN D 25 Hydroxy (Vit-D Deficiency, Fractures); Future - CBC with Differential/Platelet;  Future  6. Non-seasonal allergic rhinitis due to pollen - CMP14+EGFR; Future - CBC with Differential/Platelet; Future  7. Annual physical exam - CMP14+EGFR; Future - Lipid panel; Future - VITAMIN D 25 Hydroxy (Vit-D Deficiency, Fractures); Future - TSH; Future - CBC with Differential/Platelet; Future   Labs pending Health Maintenance reviewed Diet and exercise encouraged  Follow up plan: 1 year    Evelina Dun, FNP

## 2017-09-23 MED FILL — LEVOTHYROXINE 200 MCG TAB: 200 | 90 days supply | Qty: 90 | Fill #2

## 2017-09-23 MED FILL — ELINEST-28 TABLET: 0.3-30 | 84 days supply | Qty: 84 | Fill #3

## 2017-09-23 MED FILL — SHIPPING COST: 1 days supply | Qty: 1 | Fill #2

## 2017-10-02 ENCOUNTER — Other Ambulatory Visit: Payer: Self-pay | Admitting: Family

## 2017-10-02 MED ORDER — SILVER SULFADIAZINE 1 % EX CREA: TOPICAL_CREAM | CUTANEOUS | 1 refills | Status: AC

## 2017-10-09 ENCOUNTER — Other Ambulatory Visit: Payer: Self-pay | Admitting: Family

## 2017-10-09 DIAGNOSIS — F329 Major depressive disorder, single episode, unspecified: Secondary | ICD-10-CM

## 2017-10-09 DIAGNOSIS — F32A Depression, unspecified: Secondary | ICD-10-CM

## 2017-10-09 MED ORDER — ATORVASTATIN CALCIUM 20 MG PO TABS: 20.0000 mg | ORAL_TABLET | Freq: Every day | ORAL | 3 refills | Status: DC

## 2017-10-09 MED ORDER — VITAMIN D (ERGOCALCIFEROL) 1.25 MG (50000 UNIT) PO CAPS: ORAL_CAPSULE | ORAL | 2 refills | Status: DC

## 2017-10-09 MED ORDER — BUPROPION HCL ER (SR) 150 MG PO TB12: 300.0000 mg | ORAL_TABLET | Freq: Every morning | ORAL | 0 refills | Status: DC

## 2017-10-09 MED FILL — SHIPPING COST: 1 days supply | Qty: 1 | Fill #3

## 2017-10-09 MED FILL — ATORVASTATIN CALCIUM 20 MG: 20 | 90 days supply | Qty: 90 | Fill #0

## 2017-10-09 MED FILL — BUPROPION HCL SR 150 MG TAB: 150 | 90 days supply | Qty: 180 | Fill #0

## 2017-10-09 MED FILL — VIT D2 1.25 MG (50,000 UNIT: 1.25 MG | 84 days supply | Qty: 12 | Fill #0

## 2017-10-09 NOTE — Progress Notes (Signed)
Prescription sent to pharmacy.

## 2017-10-13 ENCOUNTER — Other Ambulatory Visit: Payer: Self-pay | Admitting: *Deleted

## 2017-10-13 MED ORDER — DOXYCYCLINE HYCLATE 100 MG PO TABS: 100.0000 mg | ORAL_TABLET | Freq: Every day | ORAL | 2 refills | Status: DC

## 2017-10-24 ENCOUNTER — Other Ambulatory Visit: Payer: Self-pay | Admitting: Family Medicine

## 2017-10-24 MED ORDER — ONDANSETRON 4 MG PO TBDP: 4.0000 mg | ORAL_TABLET | Freq: Three times a day (TID) | ORAL | 0 refills | Status: DC | PRN

## 2017-10-24 NOTE — Progress Notes (Signed)
Patient with acute onset of nausea and vomiting this morning.  She took a Zofran ODT 4 mg here in office which seemed to relieve nausea.  Medication sent to pharmacy [facility] choice[facility].

## 2017-10-27 ENCOUNTER — Other Ambulatory Visit: Payer: Self-pay | Admitting: Family Medicine

## 2017-10-31 ENCOUNTER — Other Ambulatory Visit: Payer: Self-pay | Admitting: Family

## 2017-10-31 MED ORDER — TRAZODONE HCL 100 MG PO TABS: 100.0000 mg | ORAL_TABLET | Freq: Every day | ORAL | 1 refills | Status: DC

## 2017-10-31 MED ORDER — SPIRONOLACTONE 25 MG PO TABS: 25.0000 mg | ORAL_TABLET | Freq: Every day | ORAL | 1 refills | Status: DC

## 2017-10-31 MED FILL — traZODone HCL 100 MG TABS: 100 | 90 days supply | Qty: 90 | Fill #0

## 2017-10-31 MED FILL — SPIRONOLACTONE 25 MG TABS: 25 | 90 days supply | Qty: 90 | Fill #0

## 2017-10-31 MED FILL — SHIPPING COST: 1 days supply | Qty: 1 | Fill #4

## 2018-01-01 ENCOUNTER — Other Ambulatory Visit: Payer: Self-pay | Admitting: Family

## 2018-01-01 MED FILL — MONTELUKAST SOD 10 MG TAB: 10 | 90 days supply | Qty: 90 | Fill #1

## 2018-01-01 MED FILL — LEVOTHYROXINE 200 MCG TAB: 200 | 90 days supply | Qty: 90 | Fill #3

## 2018-01-02 MED FILL — ELINEST-28 TABLET: 0.3-30 | 84 days supply | Qty: 84 | Fill #0

## 2018-01-02 MED FILL — SHIPPING COST: 1 days supply | Qty: 1 | Fill #5

## 2018-01-14 ENCOUNTER — Other Ambulatory Visit: Payer: Self-pay | Admitting: Family

## 2018-01-14 MED ORDER — SPIRONOLACTONE 50 MG PO TABS: 50.0000 mg | ORAL_TABLET | Freq: Every day | ORAL | 1 refills | Status: DC

## 2018-01-14 MED FILL — SPIRONOLACTONE 50 MG TABS: 50 | 90 days supply | Qty: 90 | Fill #0

## 2018-01-14 MED FILL — SHIPPING COST: 1 days supply | Qty: 1 | Fill #6

## 2018-01-16 ENCOUNTER — Other Ambulatory Visit: Payer: Self-pay | Admitting: Family

## 2018-01-16 DIAGNOSIS — F329 Major depressive disorder, single episode, unspecified: Secondary | ICD-10-CM

## 2018-01-16 DIAGNOSIS — F32A Depression, unspecified: Secondary | ICD-10-CM

## 2018-01-16 MED FILL — VIT D2 1.25 MG (50,000 UNIT: 1.25 MG | 84 days supply | Qty: 12 | Fill #1

## 2018-01-16 MED FILL — ATORVASTATIN CALCIUM 20 MG: 20 | 90 days supply | Qty: 90 | Fill #1

## 2018-01-19 MED FILL — BUPROPION SR 150 MG TABLET: 150 | 90 days supply | Qty: 180 | Fill #0

## 2018-01-19 MED FILL — SHIPPING COST: 1 days supply | Qty: 1 | Fill #7

## 2018-01-19 NOTE — Telephone Encounter (Signed)
Last seen 08/14/17 

## 2018-01-30 DIAGNOSIS — H5213 Myopia, bilateral: Secondary | ICD-10-CM | POA: Diagnosis not present

## 2018-04-01 ENCOUNTER — Other Ambulatory Visit: Payer: Self-pay | Admitting: Family

## 2018-04-01 MED FILL — SHIPPING COST: 1 days supply | Qty: 1 | Fill #8

## 2018-04-01 MED FILL — ELINEST-28 TABLET: 0.3-30 | 84 days supply | Qty: 84 | Fill #1

## 2018-04-02 MED FILL — SHIPPING COST: 1 days supply | Qty: 1 | Fill #9

## 2018-04-02 MED FILL — LEVOTHYROXINE 200 MCG TAB: 200 | 90 days supply | Qty: 90 | Fill #0

## 2018-04-10 ENCOUNTER — Ambulatory Visit (INDEPENDENT_AMBULATORY_CARE_PROVIDER_SITE_OTHER): Payer: 59

## 2018-04-10 ENCOUNTER — Ambulatory Visit: Payer: 59 | Admitting: Family

## 2018-04-10 ENCOUNTER — Encounter: Payer: Self-pay | Admitting: Family

## 2018-04-10 VITALS — BP 126/81 | HR 77 | Ht 63.0 in | Wt 250.0 lb

## 2018-04-10 DIAGNOSIS — M5412 Radiculopathy, cervical region: Secondary | ICD-10-CM

## 2018-04-10 DIAGNOSIS — Z6841 Body Mass Index (BMI) 40.0 and over, adult: Secondary | ICD-10-CM

## 2018-04-10 DIAGNOSIS — E559 Vitamin D deficiency, unspecified: Secondary | ICD-10-CM

## 2018-04-10 DIAGNOSIS — E7849 Other hyperlipidemia: Secondary | ICD-10-CM | POA: Diagnosis not present

## 2018-04-10 DIAGNOSIS — Z0000 Encounter for general adult medical examination without abnormal findings: Secondary | ICD-10-CM

## 2018-04-10 DIAGNOSIS — Z0001 Encounter for general adult medical examination with abnormal findings: Secondary | ICD-10-CM

## 2018-04-10 DIAGNOSIS — E039 Hypothyroidism, unspecified: Secondary | ICD-10-CM | POA: Diagnosis not present

## 2018-04-10 DIAGNOSIS — F3289 Other specified depressive episodes: Secondary | ICD-10-CM

## 2018-04-10 DIAGNOSIS — M542 Cervicalgia: Secondary | ICD-10-CM | POA: Diagnosis not present

## 2018-04-10 DIAGNOSIS — F5081 Binge eating disorder: Secondary | ICD-10-CM

## 2018-04-10 MED ORDER — LISDEXAMFETAMINE DIMESYLATE 40 MG PO CAPS: 40.0000 mg | ORAL_CAPSULE | ORAL | 0 refills | Status: DC

## 2018-04-10 MED ORDER — GABAPENTIN 100 MG PO CAPS: ORAL_CAPSULE | ORAL | 3 refills | Status: AC

## 2018-04-10 MED ORDER — NORGESTREL-ETHINYL ESTRADIOL 0.3-30 MG-MCG PO TABS: 1.0000 | ORAL_TABLET | Freq: Every day | ORAL | 2 refills | Status: DC

## 2018-04-10 MED ORDER — MONTELUKAST SODIUM 10 MG PO TABS: 10.0000 mg | ORAL_TABLET | Freq: Every day | ORAL | 1 refills | Status: DC

## 2018-04-10 MED FILL — MONTELUKAST SOD 10 MG TAB: 10 | 90 days supply | Qty: 90 | Fill #0

## 2018-04-10 MED FILL — GABAPENTIN 100 MG CAPSULE: 100 | 30 days supply | Qty: 90 | Fill #0

## 2018-04-10 MED FILL — SHIPPING COST: 1 days supply | Qty: 1 | Fill #0

## 2018-04-10 NOTE — Progress Notes (Signed)
 Subjective:    Patient ID: Chloe Byrd, female    DOB: 04/14/1970, 47 y.o.   MRN: [id number]  Chief Complaint  Patient presents with  . Neck Pain   Pt presents to the office today with CPE and  recurrent neck pain.   She has a hx binge eating and states this is something she is struggling with this her entire life. She states she has taken several medications in the past.  Neck Pain   This is a recurrent problem. The current episode started more than 1 month ago. The problem occurs intermittently. The problem has been waxing and waning. The pain is associated with nothing. The pain is present in the left side. The quality of the pain is described as burning. The pain is at a severity of 5/10. The pain is moderate. The symptoms are aggravated by twisting. Pertinent negatives include no headaches, photophobia, trouble swallowing or visual change. She has tried NSAIDs and oral narcotics for the symptoms. The treatment provided mild relief.  Thyroid Problem  Presents for follow-up visit. Symptoms include fatigue. Patient reports no constipation, depressed mood, diaphoresis or visual change. The symptoms have been stable. Her past medical history is significant for hyperlipidemia.  Hyperlipidemia  This is a chronic problem. The current episode started more than 1 year ago. The problem is controlled. Recent lipid tests were reviewed and are normal. Exacerbating diseases include obesity. Current antihyperlipidemic treatment includes statins. The current treatment provides moderate improvement of lipids. Risk factors for coronary artery disease include dyslipidemia.      Review of Systems  Constitutional: Positive for fatigue. Negative for diaphoresis.  HENT: Negative for trouble swallowing.   Eyes: Negative for photophobia.  Gastrointestinal: Negative for constipation.  Musculoskeletal: Positive for neck pain.  Neurological: Negative for headaches.  All other systems reviewed and are  negative.      Objective:   Physical Exam Vitals signs reviewed.  Constitutional:      General: She is not in acute distress.    Appearance: She is well-developed. She is obese.  HENT:     Head: Normocephalic and atraumatic.     Right Ear: Tympanic membrane normal.     Left Ear: Tympanic membrane normal.  Eyes:     Pupils: Pupils are equal, round, and reactive to light.  Neck:     Musculoskeletal: Normal range of motion and neck supple.     Thyroid: No thyromegaly.  Cardiovascular:     Rate and Rhythm: Normal rate and regular rhythm.     Heart sounds: Normal heart sounds. No murmur.  Pulmonary:     Effort: Pulmonary effort is normal. No respiratory distress.     Breath sounds: Normal breath sounds. No wheezing.  Abdominal:     General: Bowel sounds are normal. There is no distension.     Palpations: Abdomen is soft.     Tenderness: There is no abdominal tenderness.  Musculoskeletal: Normal range of motion.        General: No tenderness.     Comments: Full of left shoulder pain  Skin:    General: Skin is warm and dry.  Neurological:     Mental Status: She is alert and oriented to person, place, and time.     Cranial Nerves: No cranial nerve deficit.     Deep Tendon Reflexes: Reflexes are normal and symmetric.  Psychiatric:        Behavior: Behavior normal.        Thought Content: Thought   content normal.        Judgment: Judgment normal.     BP 126/81   Pulse 77   Ht 5' 3" (1.6 m)   Wt 250 lb (113.4 kg)   BMI 44.29 kg/m      Assessment & Plan:  Shandrea Lusk comes in today with chief complaint of Neck Pain   Diagnosis and orders addressed:  1. Neck pain - DG Cervical Spine Complete; Future - gabapentin (NEURONTIN) 100 MG capsule; 100-300 mg qsh  Dispense: 90 capsule; Refill: 3 - CMP14+EGFR; Future - CBC with Differential/Platelet; Future  2. Cervical radiculopathy Rest Ice ROM exercises encouraged - DG Cervical Spine Complete; Future -  gabapentin (NEURONTIN) 100 MG capsule; 100-300 mg qsh  Dispense: 90 capsule; Refill: 3 - CMP14+EGFR; Future - CBC with Differential/Platelet; Future  3. Binge eating disorder Discussed therapy  - lisdexamfetamine (VYVANSE) 40 MG capsule; Take 1 capsule (40 mg total) by mouth every morning.  Dispense: 30 capsule; Refill: 0 - lisdexamfetamine (VYVANSE) 40 MG capsule; Take 1 capsule (40 mg total) by mouth every morning.  Dispense: 30 capsule; Refill: 0 - lisdexamfetamine (VYVANSE) 40 MG capsule; Take 1 capsule (40 mg total) by mouth every morning.  Dispense: 30 capsule; Refill: 0 - CMP14+EGFR; Future - CBC with Differential/Platelet; Future  4. Hypothyroidism, unspecified type - CMP14+EGFR; Future - CBC with Differential/Platelet; Future - TSH; Future  5. Morbid obesity with BMI of 40.0-44.9, adult ([place]) - CMP14+EGFR; Future - CBC with Differential/Platelet; Future  6. Other hyperlipidemia - CMP14+EGFR; Future - CBC with Differential/Platelet; Future - Lipid panel; Future  7. Other depression - CMP14+EGFR; Future - CBC with Differential/Platelet; Future  8. Vitamin D deficiency - CMP14+EGFR; Future - CBC with Differential/Platelet; Future - VITAMIN D 25 Hydroxy (Vit-D Deficiency, Fractures); Future  9. Annual physical exam - CMP14+EGFR; Future - CBC with Differential/Platelet; Future - Lipid panel; Future - VITAMIN D 25 Hydroxy (Vit-D Deficiency, Fractures); Future - TSH; Future   Labs pending Health Maintenance reviewed Diet and exercise encouraged  Follow up plan: 3 months  Evelina Dun, FNP

## 2018-04-10 NOTE — Patient Instructions (Signed)
Binge-Eating Disorder Binge-eating disorder is a problem that involves repeated episodes of binge-eating. Binge-eating refers to eating a larger-than-normal amount of food in a short period of time, usually within 2 hours. People with this condition may eat even when they are not hungry, and they do not stop eating even when they feel full. People with binge-eating disorder feel unable to control their eating. Although they feel bad about overeating, they usually do not try to undo the bingeing by using laxatives or making themselves vomit. They do not starve themselves or exercise too much. Binge-eating disorder usually starts in the teenage years or early 20s. It often gets worse with stress. What are the causes? The cause of this condition is not known. What increases the risk? The following factors may make you more likely to develop this condition:  Being a teenager or in your early 20s.  Being female. Binge-eating disorder can affect males, but it is more common in females.  Being overweight or obese.  Having a mental health disorder, such as depression or anxiety.  Having a substance use disorder, such as alcohol use disorder.  Having a history of unhealthy dieting, such as meal skipping, yo-yo dieting, food restricting, or avoiding certain kinds of foods. What are the signs or symptoms? Symptoms of this condition include:  Eating much more quickly than normal.  Eating to the point of feeling physically uncomfortable.  Eating large amounts of food when you are not hungry.  Eating alone because you are embarrassed about how much you are eating.  Feeling disgusted, depressed, or guilty after overeating. How is this diagnosed? This condition is diagnosed through an assessment by your health care provider. You may be diagnosed with the disorder if you:  Binge-eat an average of one or more times a week for three months or longer.  Have three or more of the symptoms of the  disorder. Once you have been diagnosed, your level of binge-eating disorder will be rated from mild to severe. The rating is based on how often you binge-eat. How is this treated? This condition may be treated with:  Cognitive behavioral therapy (CBT). This is a form of talk therapy that helps you recognize the thoughts, beliefs, and emotions that contribute to overeating. It also helps you change them.  Interpersonal psychotherapy. This is a form of talk therapy that focuses on fixing relationship problems that trigger binge-eating episodes.  Dialectical behavioral therapy (DBT). This is a form of talk therapy that helps you learn skills to control your emotions and tolerate distress without binge-eating.  Medicine.  Weight-loss programs. These can be important if you are overweight. Losing excess weight can improve your physical health and the way you feel about yourself. Treatment is usually provided by mental health professionals, such as psychologists, psychiatrists, licensed professional counselors, and clinical social workers. Follow these instructions at home: Lifestyle      Eat a healthy diet that consists of lean meats and low-fat dairy products, as well as foods that are high in fiber, such as fresh fruits and vegetables, whole grains, and beans.  Work to develop a healthy relationship with food. Talk with your health care provider or a nutrition specialist (dietitian). He or she can provide guidance about healthy eating and healthy lifestyle choices.  Start an exercise routine and stay active. Aim for 30 or more minutes of exercise a day on 5 or more days a week to keep your body strong and healthy. You may need to exercise more if   you want to lose weight. Talk with your health care provider about how much and what type of exercise you can do. Some ways to be active include: ? Playing sports. ? Biking. ? Skating or skateboarding. ? Dancing. ? Running, walking, jogging, or  hiking. ? Doing yard work. General instructions  Take over-the-counter and prescription medicines only as told by your health care provider.  Drink enough fluid to keep your urine pale yellow.  Keep all follow-up visits as told by your health care provider. This is important. Where to find more information National Eating Disorders Association (NEDA): www.nationaleatingdisorders.org Contact a health care provider if:  Your symptoms get worse.  You start having new symptoms.  You start compensating for eating binges with harmful behaviors, such as: ? Making yourself vomit. ? Exercising too much. ? Using laxatives. Get help right away if:  You have serious thoughts about hurting yourself or someone else. If you ever feel like you may hurt yourself or others, or have thoughts about taking your own life, get help right away. You can go to your nearest emergency department or call:  Your local emergency services (911 in the [place]  A suicide crisis helpline, such as the National Suicide Prevention Lifeline at 1-800-273-8255. This is open 24 hours a day. Summary  You may have binge-eating disorder if you have feelings of guilt from overeating, eat to the point of feeling uncomfortable, eat a large amount of food in a short time, or find yourself eating when you are not hungry. Seek help from your health care provider.  The exact cause of a binge-eating disorder is not known. There are some risk factors for this disease, such as having a mental health disorder and having a history of unhealthy dieting.  There are a variety of treatment options such as counseling therapy, medicines, and learning healthy ways to lose or maintain your weight. These can help you overcome your binge-eating disorder. This information is not intended to replace advice given to you by your health care provider. Make sure you discuss any questions you have with your health care provider. Document Released:  08/01/2010 Document Revised: 12/09/2016 Document Reviewed: 12/09/2016 Elsevier Interactive Patient Education  2019 Elsevier Inc.  

## 2018-04-14 ENCOUNTER — Ambulatory Visit: Payer: 59 | Admitting: Family

## 2018-04-30 DIAGNOSIS — E039 Hypothyroidism, unspecified: Secondary | ICD-10-CM | POA: Diagnosis not present

## 2018-04-30 DIAGNOSIS — F5081 Binge eating disorder: Secondary | ICD-10-CM | POA: Diagnosis not present

## 2018-04-30 DIAGNOSIS — M542 Cervicalgia: Secondary | ICD-10-CM | POA: Diagnosis not present

## 2018-04-30 DIAGNOSIS — E559 Vitamin D deficiency, unspecified: Secondary | ICD-10-CM | POA: Diagnosis not present

## 2018-04-30 DIAGNOSIS — Z0000 Encounter for general adult medical examination without abnormal findings: Secondary | ICD-10-CM | POA: Diagnosis not present

## 2018-04-30 DIAGNOSIS — Z6841 Body Mass Index (BMI) 40.0 and over, adult: Secondary | ICD-10-CM | POA: Diagnosis not present

## 2018-04-30 DIAGNOSIS — E7849 Other hyperlipidemia: Secondary | ICD-10-CM | POA: Diagnosis not present

## 2018-04-30 DIAGNOSIS — M5412 Radiculopathy, cervical region: Secondary | ICD-10-CM | POA: Diagnosis not present

## 2018-04-30 NOTE — Addendum Note (Signed)
Addended by: Prescott GumLAND, Jamirra Curnow M on: 04/30/2018 09:34 AM   Modules accepted: Orders

## 2018-05-01 LAB — CMP14+EGFR
ALT: 17 IU/L (ref 0–32)
AST: 13 IU/L (ref 0–40)
Albumin/Globulin Ratio: 2 (ref 1.2–2.2)
Albumin: 4.3 g/dL (ref 3.8–4.8)
Alkaline Phosphatase: 80 IU/L (ref 39–117)
BUN/Creatinine Ratio: 18 (ref 9–23)
BUN: 14 mg/dL (ref 6–24)
Bilirubin Total: 0.3 mg/dL (ref 0.0–1.2)
CO2: 20 mmol/L (ref 20–29)
Calcium: 9.4 mg/dL (ref 8.7–10.2)
Chloride: 103 mmol/L (ref 96–106)
Creatinine, Ser: 0.79 mg/dL (ref 0.57–1.00)
GFR calc Af Amer: 102 mL/min/{1.73_m2} (ref >59)
GFR calc non Af Amer: 89 mL/min/{1.73_m2} (ref >59)
GLUCOSE: 83 mg/dL (ref 65–99)
Globulin, Total: 2.1 g/dL (ref 1.5–4.5)
POTASSIUM: 4.5 mmol/L (ref 3.5–5.2)
SODIUM: 140 mmol/L (ref 134–144)
Total Protein: 6.4 g/dL (ref 6.0–8.5)

## 2018-05-01 LAB — CBC WITH DIFFERENTIAL/PLATELET
BASOS ABS: 0 10*3/uL (ref 0.0–0.2)
Basos: 0 %
EOS (ABSOLUTE): 0.4 10*3/uL (ref 0.0–0.4)
Eos: 4 %
Hematocrit: 36.8 % (ref 34.0–46.6)
Hemoglobin: 12.2 g/dL (ref 11.1–15.9)
IMMATURE GRANS (ABS): 0 10*3/uL (ref 0.0–0.1)
Immature Granulocytes: 0 %
Lymphocytes Absolute: 2.7 10*3/uL (ref 0.7–3.1)
Lymphs: 30 %
MCH: 30.7 pg (ref 26.6–33.0)
MCHC: 33.2 g/dL (ref 31.5–35.7)
MCV: 93 fL (ref 79–97)
Monocytes Absolute: 0.6 10*3/uL (ref 0.1–0.9)
Monocytes: 6 %
Neutrophils Absolute: 5.5 10*3/uL (ref 1.4–7.0)
Neutrophils: 60 %
Platelets: 395 10*3/uL (ref 150–450)
RBC: 3.98 x10E6/uL (ref 3.77–5.28)
RDW: 13.3 % (ref 11.7–15.4)
WBC: 9.2 10*3/uL (ref 3.4–10.8)

## 2018-05-01 LAB — LIPID PANEL
Chol/HDL Ratio: 3.3 ratio (ref 0.0–4.4)
Cholesterol, Total: 149 mg/dL (ref 100–199)
HDL: 45 mg/dL (ref >39)
LDL Calculated: 83 mg/dL (ref 0–99)
Triglycerides: 104 mg/dL (ref 0–149)
VLDL Cholesterol Cal: 21 mg/dL (ref 5–40)

## 2018-05-01 LAB — TSH: TSH: 1.01 u[IU]/mL (ref 0.450–4.500)

## 2018-05-01 LAB — VITAMIN D 25 HYDROXY (VIT D DEFICIENCY, FRACTURES): Vit D, 25-Hydroxy: 37 ng/mL (ref 30.0–100.0)

## 2018-05-11 ENCOUNTER — Encounter: Payer: Self-pay | Admitting: Family

## 2018-05-11 ENCOUNTER — Ambulatory Visit: Payer: 59 | Admitting: Family

## 2018-05-11 VITALS — BP 111/71 | HR 79 | Temp 97.8°F | Ht 63.0 in | Wt 262.0 lb

## 2018-05-11 DIAGNOSIS — L709 Acne, unspecified: Secondary | ICD-10-CM | POA: Diagnosis not present

## 2018-05-11 DIAGNOSIS — F5081 Binge eating disorder: Secondary | ICD-10-CM

## 2018-05-11 DIAGNOSIS — Z6841 Body Mass Index (BMI) 40.0 and over, adult: Secondary | ICD-10-CM

## 2018-05-11 MED ORDER — LISDEXAMFETAMINE DIMESYLATE 50 MG PO CAPS: 50.0000 mg | ORAL_CAPSULE | Freq: Every day | ORAL | 0 refills | Status: DC

## 2018-05-11 MED ORDER — DOXYCYCLINE HYCLATE 100 MG PO TABS: 100.0000 mg | ORAL_TABLET | Freq: Every day | ORAL | 1 refills | Status: DC

## 2018-05-11 NOTE — Progress Notes (Signed)
   Subjective:    Patient ID: Chloe Byrd, female    DOB: 1970/05/20, 48 y.o.   MRN: [id number]  No chief complaint on file.   HPI PT presents to the office today discuss binge eating  and acne. She states she has taken doxycycline that has helped her acne in the past, but has stopped it. She states she stopped it 4 months ago and was doing well, but noticed large "bumps" and her nose is swollen now.   [name] She states she would like to increase this to 50 mg.  She states she has lost 13 lbs as she was 275 lb and now is 262 lb.    Review of Systems  All other systems reviewed and are negative.      Objective:   Physical Exam Vitals signs reviewed.  Constitutional:      General: She is not in acute distress.    Appearance: She is well-developed.  HENT:     Head: Normocephalic and atraumatic.     Right Ear: Tympanic membrane normal.     Left Ear: Tympanic membrane normal.  Eyes:     Pupils: Pupils are equal, round, and reactive to light.  Neck:     Musculoskeletal: Normal range of motion and neck supple.     Thyroid: No thyromegaly.  Cardiovascular:     Rate and Rhythm: Normal rate and regular rhythm.     Heart sounds: Normal heart sounds. No murmur.  Pulmonary:     Effort: Pulmonary effort is normal. No respiratory distress.     Breath sounds: Normal breath sounds. No wheezing.  Abdominal:     General: Bowel sounds are normal. There is no distension.     Palpations: Abdomen is soft.     Tenderness: There is no abdominal tenderness.  Musculoskeletal: Normal range of motion.        General: No tenderness.  Skin:    General: Skin is warm and dry.     Findings: Rash present. Rash is pustular (nose, erythemas).  Neurological:     Mental Status: She is alert and oriented to person, place, and time.     Cranial Nerves: No cranial nerve deficit.     Deep Tendon Reflexes: Reflexes are normal and symmetric.  Psychiatric:         Behavior: Behavior normal.        Thought Content: Thought content normal.        Judgment: Judgment normal.       BP 111/71   Pulse 79   Temp 97.8 F (36.6 C) (Oral)   Ht 5\' 3"  (1.6 m)   Wt 262 lb (118.8 kg)   BMI 46.41 kg/m      Assessment & Plan:  Chloe Byrd comes in today with chief complaint of No chief complaint on file.   Diagnosis and orders addressed:  1. Binge eating disorder Will increase Vyvyase to 50 mg from 40 mg  Stress management discussed Continue low calorie diet and exercise - lisdexamfetamine (VYVANSE) 50 MG capsule; Take 1 capsule (50 mg total) by mouth daily.  Dispense: 30 capsule; Refill: 0  2. Morbid obesity with BMI of 40.0-44.9, adult (HCC)  3. Acne, unspecified acne type Continue washing face daily Stress management  - doxycycline (VIBRA-TABS) 100 MG tablet; Take 1 tablet (100 mg total) by mouth daily.  Dispense: 90 tablet; Refill: 1   Chloe Rodney, FNP

## 2018-05-11 NOTE — Patient Instructions (Signed)
Acne    Acne is a skin problem that causes pimples and other skin changes. The skin has many tiny openings called pores. Each pore contains an oil gland. Oil glands make an oily substance that is called sebum. Acne occurs when the pores in the skin get blocked. The pores may become infected with bacteria, or they may become red, sore, and swollen. Acne is a common skin problem, especially for teenagers. It often occurs on the face, neck, chest, upper arms, and back. Acne usually goes away over time.  What are the causes?  Acne is caused when oil glands get blocked with sebum, dead skin cells, and dirt. The bacteria that are normally found in the oil glands then multiply and cause inflammation.  Acne is commonly triggered by changes in your hormones. These hormonal changes can cause the oil glands to get bigger and to make more sebum. Factors that can make acne worse include:  · Hormone changes during:  ? Adolescence.  ? Women's menstrual cycles.  ? Pregnancy.  · Oil-based cosmetics and hair products.  · Stress.  · Hormone problems that are caused by certain diseases.  · Certain medicines.  · Pressure from headbands, backpacks, or shoulder pads.  · Exposure to certain oils and chemicals.  · Eating a diet high in carbohydrates that quickly turn to sugar. These include dairy products, desserts, and chocolates.  What increases the risk?  This condition is more likely to develop in:  · Teenagers.  · People who have a family history of acne.  What are the signs or symptoms?  Symptoms include:  · Small, red bumps (pimples or papules).  · Whiteheads.  · Blackheads.  · Small, pus-filled pimples (pustules).  · Big, red pimples or pustules that feel tender.  More severe acne can cause:  · An abscess. This is an infected area that contains a collection of pus.  · Cysts. These are hard, painful, fluid-filled sacs.  · Scars. These can happen after large pimples heal.  How is this diagnosed?  This condition is diagnosed with a  medical history and physical exam. Blood tests may also be done.  How is this treated?  Treatment for this condition can vary depending on the severity of your acne. Treatment may include:  · Creams and lotions that prevent oil glands from clogging.  · Creams and lotions that treat or prevent infections and inflammation.  · Antibiotic medicines that are applied to the skin or taken as a pill.  · Pills that decrease sebum production.  · Birth control pills.  · Light or laser treatments.  · Injections of medicine into the affected areas.  · Chemicals that cause peeling of the skin.  · Surgery.  Your health care provider will also recommend the best way to take care of your skin. Good skin care is the most important part of treatment.  Follow these instructions at home:  Skin care  Take care of your skin as told by your health care provider. You may be told to do these things:  · Wash your skin gently at least two times each day, as well as:  ? After you exercise.  ? Before you go to bed.  · Use mild soap.  · Apply a water-based skin moisturizer after you wash your skin.  · Use a sunscreen or sunblock with SPF 30 or greater. This is especially important if you are using acne medicines.  · Choose cosmetics that will not block your   oil glands (are noncomedogenic).  Medicines  · Take over-the-counter and prescription medicines only as told by your health care provider.  · If you were prescribed an antibiotic medicine, apply it or take it as told by your health care provider. Do not stop using the antibiotic even if your condition improves.  General instructions  · Keep your hair clean and off your face. If you have oily hair, shampoo your hair regularly or daily.  · Avoid wearing tight headbands or hats.  · Avoid picking or squeezing your pimples. That can make your acne worse and cause scarring.  · Shave gently and only when necessary.  · Keep a food journal to figure out if any foods are linked to your acne. Avoid dairy  products, desserts, and chocolates.  · Take steps to manage and reduce stress.  · Keep all follow-up visits as told by your health care provider. This is important.  Contact a health care provider if:  · Your acne is not better after eight weeks.  · Your acne gets worse.  · You have a large area of skin that is red or tender.  · You think that you are having side effects from any acne medicine.  Summary  · Acne is a skin problem that causes pimples and other skin changes. Acne is a common skin problem, especially for teenagers. Acne usually goes away over time.  · Acne is commonly triggered by changes in your hormones. There are many other causes, such as stress, diet, and certain medicines.  · Follow your health care provider's instructions for how to take care of your skin. Good skin care is the most important part of treatment.  · Take over-the-counter and prescription medicines only as told by your health care provider.  · Contact your health care provider if you think that you are having side effects from any acne medicine.  This information is not intended to replace advice given to you by your health care provider. Make sure you discuss any questions you have with your health care provider.  Document Released: 03/08/2000 Document Revised: 07/22/2017 Document Reviewed: 07/22/2017  Elsevier Interactive Patient Education © 2019 Elsevier Inc.

## 2018-05-14 MED FILL — BUPROPION HCL SR 150 MG TAB: 150 | 90 days supply | Qty: 180 | Fill #1

## 2018-05-14 MED FILL — ATORVASTATIN 20 MG TABLET: 20 | 90 days supply | Qty: 90 | Fill #2

## 2018-05-14 MED FILL — SHIPPING COST: 1 days supply | Qty: 1 | Fill #1

## 2018-06-19 MED FILL — VIT D2 1.25 MG (50,000 UNIT: 1.25 MG | 84 days supply | Qty: 12 | Fill #2

## 2018-06-19 MED FILL — ELINEST-28 TABLET: 0.3-30 | 84 days supply | Qty: 84 | Fill #2

## 2018-07-10 ENCOUNTER — Other Ambulatory Visit: Payer: Self-pay | Admitting: Family

## 2018-07-10 MED FILL — MONTELUKAST SOD 10 MG TAB: 10 | 90 days supply | Qty: 90 | Fill #1

## 2018-07-10 MED FILL — LEVOTHYROXINE 200 MCG TAB: 200 | 90 days supply | Qty: 90 | Fill #0

## 2018-07-31 ENCOUNTER — Encounter: Payer: Self-pay | Admitting: Family

## 2018-07-31 ENCOUNTER — Telehealth (INDEPENDENT_AMBULATORY_CARE_PROVIDER_SITE_OTHER): Payer: 59 | Admitting: Family

## 2018-07-31 ENCOUNTER — Other Ambulatory Visit: Payer: Self-pay

## 2018-07-31 DIAGNOSIS — Z6841 Body Mass Index (BMI) 40.0 and over, adult: Secondary | ICD-10-CM | POA: Diagnosis not present

## 2018-07-31 DIAGNOSIS — F5081 Binge eating disorder: Secondary | ICD-10-CM | POA: Diagnosis not present

## 2018-07-31 MED ORDER — LISDEXAMFETAMINE DIMESYLATE 50 MG PO CAPS: 50.0000 mg | ORAL_CAPSULE | Freq: Every day | ORAL | 0 refills | Status: DC

## 2018-07-31 MED FILL — VYVANSE 50 MG CAPSULE: 50 | 30 days supply | Qty: 30 | Fill #0

## 2018-07-31 NOTE — Progress Notes (Signed)
   Virtual Visit via telephone Note  I connected with Chloe Byrd on 07/31/18 at 1:01 pm by Video visit and verified that I am speaking with the correct person using two identifiers. Chloe Byrd is currently located at work and no one is currently with her during visit. The provider, Jannifer Rodney, FNP is located in their office at time of visit.  I discussed the limitations, risks, security and privacy concerns of performing an evaluation and management service by telephone and the availability of in person appointments. I also discussed with the patient that there may be a patient responsible charge related to this service. The patient expressed understanding and agreed to proceed.   History and Present Illness:  HPI PT calls today requesting refill medication on vyvanse 50 mg for binge eating. She states she had stopped her medication over the last few months because she "ran out". She has noticed an increase in her eating habits related to stress because of COVID.     Review of Systems  All other systems reviewed and are negative.    Observations/Objective: No SOB or distress noted  Assessment and Plan: Chloe Byrd comes in today with chief complaint of No chief complaint on file.   Diagnosis and orders addressed:  1. Binge eating disorder - lisdexamfetamine (VYVANSE) 50 MG capsule; Take 1 capsule (50 mg total) by mouth daily.  Dispense: 30 capsule; Refill: 0 - lisdexamfetamine (VYVANSE) 50 MG capsule; Take 1 capsule (50 mg total) by mouth daily.  Dispense: 30 capsule; Refill: 0 - lisdexamfetamine (VYVANSE) 50 MG capsule; Take 1 capsule (50 mg total) by mouth daily.  Dispense: 30 capsule; Refill: 0  2. Morbid obesity with BMI of 40.0-44.9, adult (HCC) - lisdexamfetamine (VYVANSE) 50 MG capsule; Take 1 capsule (50 mg total) by mouth daily.  Dispense: 30 capsule; Refill: 0 - lisdexamfetamine (VYVANSE) 50 MG capsule; Take 1 capsule (50 mg total) by mouth daily.   Dispense: 30 capsule; Refill: 0 - lisdexamfetamine (VYVANSE) 50 MG capsule; Take 1 capsule (50 mg total) by mouth daily.  Dispense: 30 capsule; Refill: 0  Restart Vyvanse Healthy diet encouraged Stress management    Follow up plan: 3 months       I discussed the assessment and treatment plan with the patient. The patient was provided an opportunity to ask questions and all were answered. The patient agreed with the plan and demonstrated an understanding of the instructions.   The patient was advised to call back or seek an in-person evaluation if the symptoms worsen or if the condition fails to improve as anticipated.  The above assessment and management plan was discussed with the patient. The patient verbalized understanding of and has agreed to the management plan. Patient is aware to call the clinic if symptoms persist or worsen. Patient is aware when to return to the clinic for a follow-up visit. Patient educated on when it is appropriate to go to the emergency department.   Time call ended:  1:16  I provided 15 minutes of non-face-to-face time during this encounter.    Jannifer Rodney, FNP

## 2018-08-22 MED FILL — BUPROPION HCL SR 150 MG TAB: 150 | 90 days supply | Qty: 180 | Fill #2

## 2018-08-22 MED FILL — ATORVASTATIN 20 MG TABLET: 20 | 90 days supply | Qty: 90 | Fill #3

## 2018-09-04 ENCOUNTER — Ambulatory Visit (INDEPENDENT_AMBULATORY_CARE_PROVIDER_SITE_OTHER): Payer: 59 | Admitting: Family

## 2018-09-04 ENCOUNTER — Encounter: Payer: Self-pay | Admitting: Family

## 2018-09-04 ENCOUNTER — Other Ambulatory Visit: Payer: Self-pay

## 2018-09-04 DIAGNOSIS — Z6841 Body Mass Index (BMI) 40.0 and over, adult: Secondary | ICD-10-CM | POA: Diagnosis not present

## 2018-09-04 DIAGNOSIS — F5081 Binge eating disorder: Secondary | ICD-10-CM | POA: Diagnosis not present

## 2018-09-04 MED ORDER — LISDEXAMFETAMINE DIMESYLATE 60 MG PO CAPS: 60.0000 mg | ORAL_CAPSULE | ORAL | 0 refills | Status: DC

## 2018-09-04 NOTE — Progress Notes (Signed)
   Virtual Visit via telephone Note  I connected with Christella Noa on 09/04/18 at 12:44 pm by telephone and verified that I am speaking with the correct person using two identifiers. Sanvika Cuttino is currently located at work and no one is currently with her during visit. The provider, Evelina Dun, FNP is located in their office at time of visit.  I discussed the limitations, risks, security and privacy concerns of performing an evaluation and management service by telephone and the availability of in person appointments. I also discussed with the patient that there may be a patient responsible charge related to this service. The patient expressed understanding and agreed to proceed.   History and Present Illness:  HPI Pt calls the office today to discuss Vyvanse 50 mg for binge eating. She states she feels like it is helping slightly with compulsive eating. She has been able to lose 5 lbs over the last month. She would like to try to increase the dose to see if this helps more.    Review of Systems  All other systems reviewed and are negative.    Observations/Objective: No SOB or distress noted  Assessment and Plan: 1. Binge eating disorder Meds as prescribed Behavior modification as needed Follow-up for recheck in 3 months - lisdexamfetamine (VYVANSE) 60 MG capsule; Take 1 capsule (60 mg total) by mouth every morning.  Dispense: 30 capsule; Refill: 0 - lisdexamfetamine (VYVANSE) 60 MG capsule; Take 1 capsule (60 mg total) by mouth every morning.  Dispense: 30 capsule; Refill: 0 - lisdexamfetamine (VYVANSE) 60 MG capsule; Take 1 capsule (60 mg total) by mouth every morning.  Dispense: 30 capsule; Refill: 0  2. Morbid obesity with BMI of 40.0-44.9, adult ([place]) Encouraged healthy diet and exercise  - lisdexamfetamine (VYVANSE) 60 MG capsule; Take 1 capsule (60 mg total) by mouth every morning.  Dispense: 30 capsule; Refill: 0 - lisdexamfetamine (VYVANSE) 60 MG capsule;  Take 1 capsule (60 mg total) by mouth every morning.  Dispense: 30 capsule; Refill: 0 - lisdexamfetamine (VYVANSE) 60 MG capsule; Take 1 capsule (60 mg total) by mouth every morning.  Dispense: 30 capsule; Refill: 0     I discussed the assessment and treatment plan with the patient. The patient was provided an opportunity to ask questions and all were answered. The patient agreed with the plan and demonstrated an understanding of the instructions.   The patient was advised to call back or seek an in-person evaluation if the symptoms worsen or if the condition fails to improve as anticipated.  The above assessment and management plan was discussed with the patient. The patient verbalized understanding of and has agreed to the management plan. Patient is aware to call the clinic if symptoms persist or worsen. Patient is aware when to return to the clinic for a follow-up visit. Patient educated on when it is appropriate to go to the emergency department.   Time call ended: 12:57 pm   I provided 13 minutes of non-face-to-face time during this encounter.    Evelina Dun, FNP

## 2018-09-11 MED FILL — ELINEST-28 TABLET: 0.3-30 | 84 days supply | Qty: 84 | Fill #0

## 2018-10-08 ENCOUNTER — Other Ambulatory Visit: Payer: Self-pay | Admitting: Family

## 2018-10-08 DIAGNOSIS — F5081 Binge eating disorder: Secondary | ICD-10-CM

## 2018-10-08 MED ORDER — LISDEXAMFETAMINE DIMESYLATE 60 MG PO CAPS: 60.0000 mg | ORAL_CAPSULE | ORAL | 0 refills | Status: DC

## 2018-10-08 MED FILL — VYVANSE 60 MG CAPSULE: 60 | 30 days supply | Qty: 30 | Fill #0

## 2018-10-14 ENCOUNTER — Telehealth: Payer: Self-pay | Admitting: *Deleted

## 2018-10-14 NOTE — Telephone Encounter (Signed)
Prior Auth for Vyvanse 60mg -N/A  Prior Authorization is not required for this medication dosage form and strength at the quantity and days supply requested. This medication does not require a prior authorization because it is a covered benefit. Please have the pharmacy contact [facility] at [phone] if billing assistance is required. Please note this medication must be filled through a [facility]. Please contact [phone] for billing assistance.

## 2018-10-19 ENCOUNTER — Other Ambulatory Visit: Payer: Self-pay | Admitting: Family

## 2018-10-19 ENCOUNTER — Other Ambulatory Visit: Payer: Self-pay | Admitting: *Deleted

## 2018-10-19 MED ORDER — MONTELUKAST SODIUM 10 MG PO TABS: 10.0000 mg | ORAL_TABLET | Freq: Every day | ORAL | 2 refills | Status: DC

## 2018-10-19 MED ORDER — VITAMIN D (ERGOCALCIFEROL) 1.25 MG (50000 UNIT) PO CAPS: ORAL_CAPSULE | ORAL | 2 refills | Status: AC

## 2018-10-19 MED ORDER — LEVOTHYROXINE SODIUM 200 MCG PO TABS: 200.0000 ug | ORAL_TABLET | Freq: Every day | ORAL | 2 refills | Status: DC

## 2018-10-19 MED FILL — MONTELUKAST SOD 10 MG TAB: 10 | 90 days supply | Qty: 90 | Fill #0

## 2018-10-19 MED FILL — VIT D2 1.25 MG (50,000 UNIT: 1.25 MG | 84 days supply | Qty: 12 | Fill #0

## 2018-10-19 MED FILL — LEVOTHYROXINE 200 MCG TAB: 200 | 90 days supply | Qty: 90 | Fill #1

## 2018-10-22 ENCOUNTER — Other Ambulatory Visit: Payer: Self-pay | Admitting: Family

## 2018-10-22 MED ORDER — DOXYCYCLINE HYCLATE 100 MG PO TABS: 100.0000 mg | ORAL_TABLET | Freq: Two times a day (BID) | ORAL | 1 refills | Status: DC

## 2018-10-22 MED FILL — DOXYCYCLINE HYCLATE 100 MG: 100 | 90 days supply | Qty: 180 | Fill #0

## 2018-11-16 MED FILL — VYVANSE 60 MG CAPSULE: 60 | 30 days supply | Qty: 30 | Fill #0

## 2018-11-26 ENCOUNTER — Other Ambulatory Visit: Payer: Self-pay | Admitting: Family

## 2018-11-26 DIAGNOSIS — F329 Major depressive disorder, single episode, unspecified: Secondary | ICD-10-CM

## 2018-11-26 DIAGNOSIS — F32A Depression, unspecified: Secondary | ICD-10-CM

## 2018-11-26 MED FILL — BUPROPION HCL SR 150 MG TAB: 150 | 90 days supply | Qty: 180 | Fill #0

## 2018-11-26 MED FILL — ATORVASTATIN 20 MG TABLET: 20 | 90 days supply | Qty: 90 | Fill #0

## 2018-12-04 MED FILL — ELINEST-28 TABLET: 0.3-30 | 84 days supply | Qty: 84 | Fill #1

## 2018-12-16 MED FILL — VYVANSE 60 MG CAPSULE: 60 | 30 days supply | Qty: 30 | Fill #0

## 2019-01-14 MED FILL — LEVOTHYROXINE 200 MCG TAB: 200 | 90 days supply | Qty: 90 | Fill #2

## 2019-01-14 MED FILL — VIT D2 1.25 MG (50,000 UNIT: 1.25 MG | 84 days supply | Qty: 12 | Fill #1

## 2019-01-14 MED FILL — MONTELUKAST SOD 10 MG TAB: 10 | 90 days supply | Qty: 90 | Fill #1

## 2019-02-01 ENCOUNTER — Other Ambulatory Visit: Payer: Self-pay | Admitting: Family

## 2019-02-02 ENCOUNTER — Encounter: Payer: Self-pay | Admitting: Family

## 2019-02-02 ENCOUNTER — Ambulatory Visit: Payer: 59 | Admitting: Family

## 2019-02-02 ENCOUNTER — Other Ambulatory Visit: Payer: Self-pay

## 2019-02-02 VITALS — BP 110/64 | HR 64 | Ht 63.0 in | Wt 260.0 lb

## 2019-02-02 DIAGNOSIS — E559 Vitamin D deficiency, unspecified: Secondary | ICD-10-CM

## 2019-02-02 DIAGNOSIS — F5081 Binge eating disorder: Secondary | ICD-10-CM

## 2019-02-02 DIAGNOSIS — Z1231 Encounter for screening mammogram for malignant neoplasm of breast: Secondary | ICD-10-CM | POA: Diagnosis not present

## 2019-02-02 DIAGNOSIS — F3289 Other specified depressive episodes: Secondary | ICD-10-CM

## 2019-02-02 DIAGNOSIS — Z6841 Body Mass Index (BMI) 40.0 and over, adult: Secondary | ICD-10-CM | POA: Diagnosis not present

## 2019-02-02 DIAGNOSIS — E7849 Other hyperlipidemia: Secondary | ICD-10-CM | POA: Diagnosis not present

## 2019-02-02 DIAGNOSIS — E039 Hypothyroidism, unspecified: Secondary | ICD-10-CM

## 2019-02-02 DIAGNOSIS — F50819 Binge eating disorder, unspecified: Secondary | ICD-10-CM

## 2019-02-02 MED ORDER — LISDEXAMFETAMINE DIMESYLATE 60 MG PO CAPS: 60.0000 mg | ORAL_CAPSULE | ORAL | 0 refills | Status: DC

## 2019-02-02 MED FILL — VYVANSE 60 MG CAPSULE: 60 | 30 days supply | Qty: 30 | Fill #0

## 2019-02-02 NOTE — Progress Notes (Signed)
Subjective:    Patient ID: Chloe Byrd, female    DOB: September 19, 1970, 48 y.o.   MRN: [id number]  Chief Complaint  Patient presents with  . Medical Management of Chronic Issues   Pt presents to the office today for binge eating disorder and is currently taking Vyvanse 60 mg daily. She states this seems to be working well. She states she had lost 6 lbs, but states her stress has been increased and her weight has increased.  Thyroid Problem Presents for follow-up visit. Symptoms include depressed mood and fatigue. The symptoms have been stable. Her past medical history is significant for hyperlipidemia.  Hyperlipidemia This is a chronic problem. The current episode started more than 1 year ago. The problem is controlled. Recent lipid tests were reviewed and are normal. The current treatment provides moderate improvement of lipids. Risk factors for coronary artery disease include dyslipidemia, hypertension, a sedentary lifestyle and post-menopausal.  Depression        This is a chronic problem.  The current episode started more than 1 year ago.   The onset quality is gradual.   Associated symptoms include fatigue, irritable, restlessness, decreased interest and sad.  Associated symptoms include no helplessness and no hopelessness.  Past medical history includes thyroid problem.       Review of Systems  Constitutional: Positive for fatigue.  Psychiatric/Behavioral: Positive for depression.  All other systems reviewed and are negative.      Objective:   Physical Exam Vitals signs reviewed.  Constitutional:      General: She is irritable. She is not in acute distress.    Appearance: She is well-developed.  HENT:     Head: Normocephalic and atraumatic.     Right Ear: Tympanic membrane normal.     Left Ear: Tympanic membrane normal.  Eyes:     Pupils: Pupils are equal, round, and reactive to light.  Neck:     Musculoskeletal: Normal range of motion and neck supple.     Thyroid: No  thyromegaly.  Cardiovascular:     Rate and Rhythm: Normal rate and regular rhythm.     Heart sounds: Normal heart sounds. No murmur.  Pulmonary:     Effort: Pulmonary effort is normal. No respiratory distress.     Breath sounds: Normal breath sounds. No wheezing.  Abdominal:     General: Bowel sounds are normal. There is no distension.     Palpations: Abdomen is soft.     Tenderness: There is no abdominal tenderness.  Musculoskeletal: Normal range of motion.        General: No tenderness.  Skin:    General: Skin is warm and dry.  Neurological:     Mental Status: She is alert and oriented to person, place, and time.     Cranial Nerves: No cranial nerve deficit.     Deep Tendon Reflexes: Reflexes are normal and symmetric.  Psychiatric:        Behavior: Behavior normal.        Thought Content: Thought content normal.        Judgment: Judgment normal.      BP 110/64   Pulse 64   Ht 5' 3"  (1.6 m)   Wt 260 lb (117.9 kg)   BMI 46.06 kg/m       Assessment & Plan:  Chloe Byrd comes in today with chief complaint of Medical Management of Chronic Issues   Diagnosis and orders addressed:  1. Hypothyroidism, unspecified type - CMP14+EGFR; Future -  CBC with Differential/Platelet; Future - TSH; Future  2. Morbid obesity with BMI of 40.0-44.9, adult (HCC) - lisdexamfetamine (VYVANSE) 60 MG capsule; Take 1 capsule (60 mg total) by mouth every morning.  Dispense: 30 capsule; Refill: 0 - lisdexamfetamine (VYVANSE) 60 MG capsule; Take 1 capsule (60 mg total) by mouth every morning.  Dispense: 30 capsule; Refill: 0 - lisdexamfetamine (VYVANSE) 60 MG capsule; Take 1 capsule (60 mg total) by mouth every morning.  Dispense: 30 capsule; Refill: 0 - CMP14+EGFR; Future - CBC with Differential/Platelet; Future  3. Binge eating disorder - lisdexamfetamine (VYVANSE) 60 MG capsule; Take 1 capsule (60 mg total) by mouth every morning.  Dispense: 30 capsule; Refill: 0 - lisdexamfetamine  (VYVANSE) 60 MG capsule; Take 1 capsule (60 mg total) by mouth every morning.  Dispense: 30 capsule; Refill: 0 - lisdexamfetamine (VYVANSE) 60 MG capsule; Take 1 capsule (60 mg total) by mouth every morning.  Dispense: 30 capsule; Refill: 0 - CMP14+EGFR; Future - CBC with Differential/Platelet; Future  4. Other hyperlipidemia  - CMP14+EGFR; Future - CBC with Differential/Platelet; Future  5. Vitamin D deficiency - CMP14+EGFR; Future - CBC with Differential/Platelet; Future  6. Other depression - CMP14+EGFR; Future - CBC with Differential/Platelet; Future  7. Encounter for screening mammogram for malignant neoplasm of breast - MM Digital Diagnostic Bilat; Future - CMP14+EGFR; Future - CBC with Differential/Platelet; Future  Pt reviewed in [place] controlled database- No red flags noted Labs pending Health Maintenance reviewed Diet and exercise encouraged  Follow up plan: 3 months   Evelina Dun, FNP

## 2019-02-15 ENCOUNTER — Telehealth: Payer: Self-pay | Admitting: *Deleted

## 2019-02-15 MED ORDER — PREDNISONE 10 MG (21) PO TBPK: ORAL_TABLET | ORAL | 0 refills | Status: DC

## 2019-02-15 MED ORDER — AZITHROMYCIN 250 MG PO TABS: ORAL_TABLET | ORAL | 0 refills | Status: DC

## 2019-02-15 NOTE — Telephone Encounter (Signed)
positive covid, worseing cough and chest tightness? and a zpak[facility]

## 2019-02-15 NOTE — Telephone Encounter (Signed)
Prescription sent to pharmacy.

## 2019-02-23 ENCOUNTER — Other Ambulatory Visit: Payer: Self-pay | Admitting: Family

## 2019-02-23 DIAGNOSIS — N6002 Solitary cyst of left breast: Secondary | ICD-10-CM

## 2019-03-04 MED FILL — BUPROPION HCL SR 150 MG TAB: 150 | 90 days supply | Qty: 180 | Fill #1

## 2019-03-04 MED FILL — ELINEST-28 TABLET: 0.3-30 | 84 days supply | Qty: 84 | Fill #2

## 2019-03-04 MED FILL — ATORVASTATIN 20 MG TABLET: 20 | 90 days supply | Qty: 90 | Fill #1

## 2019-03-04 MED FILL — VYVANSE 60 MG CAPSULE: 60 | 30 days supply | Qty: 30 | Fill #0

## 2019-04-12 ENCOUNTER — Other Ambulatory Visit: Payer: Self-pay

## 2019-04-12 ENCOUNTER — Encounter: Payer: Self-pay | Admitting: Family

## 2019-04-12 ENCOUNTER — Ambulatory Visit (INDEPENDENT_AMBULATORY_CARE_PROVIDER_SITE_OTHER): Payer: No Typology Code available for payment source | Admitting: Family

## 2019-04-12 DIAGNOSIS — M542 Cervicalgia: Secondary | ICD-10-CM | POA: Diagnosis not present

## 2019-04-12 DIAGNOSIS — M5412 Radiculopathy, cervical region: Secondary | ICD-10-CM | POA: Diagnosis not present

## 2019-04-12 NOTE — Progress Notes (Signed)
Virtual Visit via telephone Note Due to COVID-19 pandemic this visit was conducted virtually. This visit type was conducted due to national recommendations for restrictions regarding the COVID-19 Pandemic (e.g. social distancing, sheltering in place) in an effort to limit this patient's exposure and mitigate transmission in our community. All issues noted in this document were discussed and addressed.  A physical exam was not performed with this format.  I connected with Christella Noa on 04/12/19 at 12:38 pm by telephone and verified that I am speaking with the correct person using two identifiers. Niesha Bame is currently located at work and no one is currently with her during visit. The provider, Evelina Dun, FNP is located in their office at time of visit.  I discussed the limitations, risks, security and privacy concerns of performing an evaluation and management service by telephone and the availability of in person appointments. I also discussed with the patient that there may be a patient responsible charge related to this service. The patient expressed understanding and agreed to proceed.   History and Present Illness:  Pt calls the office today with neck pain that radiates down her left arm. She reports it started in October. Since being diagnosed she has tried motrin, gabapentin, muscle relaxer. She has also been doing home exercises daily. This has not improved. She does admit her stress is increased at work, but feels like this is controlled. She denies any weakness. She reports the pain is worse at night and wakes her up at night.   Neck Pain  This is a chronic problem. The current episode started more than 1 month ago. The problem occurs constantly. The problem has been unchanged. The pain is associated with nothing. The pain is present in the left side. The quality of the pain is described as burning. The pain is at a severity of 2/10. The pain is mild. The symptoms are  aggravated by twisting. Pertinent negatives include no headaches, leg pain, numbness, photophobia, syncope, visual change or weakness. She has tried muscle relaxants, home exercises, bed rest and NSAIDs for the symptoms. The treatment provided mild relief.     Review of Systems  Eyes: Negative for photophobia.  Cardiovascular: Negative for syncope.  Musculoskeletal: Positive for neck pain.  Neurological: Negative for weakness, numbness and headaches.     Observations/Objective: No SOB or distress noted   Assessment and Plan: Lilac Hoff comes in today with chief complaint of No chief complaint on file.   Diagnosis and orders addressed:  1. Neck pain - MR Cervical Spine Wo Contrast; Future  2. Cervical radiculopathy - MR Cervical Spine Wo Contrast; Future   Continue motrin ROM exercises  MRI pending      I discussed the assessment and treatment plan with the patient. The patient was provided an opportunity to ask questions and all were answered. The patient agreed with the plan and demonstrated an understanding of the instructions.   The patient was advised to call back or seek an in-person evaluation if the symptoms worsen or if the condition fails to improve as anticipated.  The above assessment and management plan was discussed with the patient. The patient verbalized understanding of and has agreed to the management plan. Patient is aware to call the clinic if symptoms persist or worsen. Patient is aware when to return to the clinic for a follow-up visit. Patient educated on when it is appropriate to go to the emergency department.   Time call ended:  12:51  pm   I provided 13 minutes of non-face-to-face time during this encounter.    Jannifer Rodney, FNP

## 2019-04-13 ENCOUNTER — Ambulatory Visit: Payer: 59 | Admitting: Family

## 2019-04-19 MED FILL — MONTELUKAST SOD 10 MG TAB: 10 | 90 days supply | Qty: 90 | Fill #2

## 2019-04-19 MED FILL — LEVOTHYROXINE SODIUM 200 MC: 200 | 90 days supply | Qty: 90 | Fill #0

## 2019-04-19 MED FILL — VIT D2 1.25 MG (50,000 UNIT: 1.25 MG | 84 days supply | Qty: 12 | Fill #2

## 2019-04-28 ENCOUNTER — Ambulatory Visit
Admission: RE | Admit: 2019-04-28 | Discharge: 2019-04-28 | Disposition: A | Discharge status: Home / Self Care | Payer: No Typology Code available for payment source | Source: Ambulatory Visit | Attending: Family | Admitting: Family

## 2019-04-28 ENCOUNTER — Other Ambulatory Visit: Payer: Self-pay

## 2019-04-28 DIAGNOSIS — M5412 Radiculopathy, cervical region: Secondary | ICD-10-CM

## 2019-04-28 DIAGNOSIS — M542 Cervicalgia: Secondary | ICD-10-CM

## 2019-04-30 ENCOUNTER — Other Ambulatory Visit: Payer: Self-pay | Admitting: Family

## 2019-04-30 DIAGNOSIS — M5412 Radiculopathy, cervical region: Secondary | ICD-10-CM

## 2019-04-30 MED ORDER — MELOXICAM 15 MG PO TABS: 15.0000 mg | ORAL_TABLET | Freq: Every day | ORAL | 2 refills | Status: DC

## 2019-04-30 MED ORDER — METAXALONE 800 MG PO TABS: 800.0000 mg | ORAL_TABLET | Freq: Three times a day (TID) | ORAL | 1 refills | Status: AC

## 2019-04-30 MED FILL — MELOXICAM 15 MG TABLET: 15 | 30 days supply | Qty: 30 | Fill #0

## 2019-04-30 MED FILL — METAXALONE 800 MG TABLET: 800 | 30 days supply | Qty: 90 | Fill #0

## 2019-05-31 ENCOUNTER — Other Ambulatory Visit: Payer: Self-pay | Admitting: Family

## 2019-05-31 MED FILL — MELOXICAM 15 MG TABLET: 15 | 30 days supply | Qty: 30 | Fill #1

## 2019-05-31 MED FILL — ELINEST-28 TABLET: 0.3-30 | 84 days supply | Qty: 84 | Fill #0

## 2019-06-09 MED FILL — BUPROPION HCL SR 150 MG TAB: 150 | 90 days supply | Qty: 180 | Fill #2

## 2019-06-12 MED FILL — ATORVASTATIN 20 MG TABLET: 20 | 90 days supply | Qty: 90 | Fill #2

## 2019-07-01 ENCOUNTER — Ambulatory Visit: Payer: No Typology Code available for payment source

## 2019-07-12 ENCOUNTER — Other Ambulatory Visit: Payer: Self-pay | Admitting: Family

## 2019-07-13 ENCOUNTER — Ambulatory Visit (INDEPENDENT_AMBULATORY_CARE_PROVIDER_SITE_OTHER): Payer: Self-pay

## 2019-07-13 ENCOUNTER — Other Ambulatory Visit: Payer: Self-pay

## 2019-07-13 DIAGNOSIS — Z111 Encounter for screening for respiratory tuberculosis: Secondary | ICD-10-CM

## 2019-07-14 ENCOUNTER — Other Ambulatory Visit: Payer: Self-pay

## 2019-07-14 DIAGNOSIS — M5412 Radiculopathy, cervical region: Secondary | ICD-10-CM

## 2019-07-14 MED ORDER — MELOXICAM 15 MG PO TABS: 15.0000 mg | ORAL_TABLET | Freq: Every day | ORAL | 2 refills | Status: AC

## 2019-07-14 MED ORDER — ELINEST 0.3-30 MG-MCG PO TABS: 1.0000 | ORAL_TABLET | Freq: Every day | ORAL | 0 refills | Status: AC

## 2019-07-14 MED ORDER — DOXYCYCLINE HYCLATE 100 MG PO TABS: 100.0000 mg | ORAL_TABLET | Freq: Two times a day (BID) | ORAL | 1 refills | Status: AC

## 2019-07-14 MED ORDER — MONTELUKAST SODIUM 10 MG PO TABS: 10.0000 mg | ORAL_TABLET | Freq: Every day | ORAL | 2 refills | Status: AC

## 2019-07-15 ENCOUNTER — Encounter: Payer: Self-pay | Admitting: Family Medicine

## 2019-07-15 ENCOUNTER — Encounter: Payer: Self-pay | Admitting: Family

## 2019-07-15 LAB — TB SKIN TEST
Induration: 0 mm
TB Skin Test: NEGATIVE

## 2019-09-03 ENCOUNTER — Other Ambulatory Visit: Payer: Self-pay | Admitting: Family

## 2019-11-26 ENCOUNTER — Other Ambulatory Visit: Payer: Managed Care, Other (non HMO)

## 2019-11-26 ENCOUNTER — Other Ambulatory Visit: Payer: Self-pay

## 2019-11-26 ENCOUNTER — Other Ambulatory Visit: Payer: Self-pay | Admitting: Family Medicine

## 2019-11-26 DIAGNOSIS — R059 Cough, unspecified: Secondary | ICD-10-CM

## 2019-11-28 LAB — NOVEL CORONAVIRUS, NAA: SARS-CoV-2, NAA: NOT DETECTED

## 2019-12-02 ENCOUNTER — Other Ambulatory Visit: Payer: Self-pay | Admitting: Adult Health Nurse Practitioner

## 2019-12-02 DIAGNOSIS — N6320 Unspecified lump in the left breast, unspecified quadrant: Secondary | ICD-10-CM

## 2019-12-02 DIAGNOSIS — Z1231 Encounter for screening mammogram for malignant neoplasm of breast: Secondary | ICD-10-CM

## 2019-12-21 ENCOUNTER — Other Ambulatory Visit: Payer: Self-pay

## 2020-01-20 ENCOUNTER — Other Ambulatory Visit: Payer: Self-pay

## 2020-01-20 ENCOUNTER — Ambulatory Visit
Admission: RE | Admit: 2020-01-20 | Discharge: 2020-01-20 | Disposition: A | Discharge status: Home / Self Care | Payer: Managed Care, Other (non HMO) | Source: Ambulatory Visit | Attending: Adult Health Nurse Practitioner | Admitting: Adult Health Nurse Practitioner

## 2020-01-20 ENCOUNTER — Ambulatory Visit: Payer: Self-pay

## 2020-01-20 DIAGNOSIS — N6320 Unspecified lump in the left breast, unspecified quadrant: Secondary | ICD-10-CM

## 2020-03-27 ENCOUNTER — Ambulatory Visit (INDEPENDENT_AMBULATORY_CARE_PROVIDER_SITE_OTHER): Payer: Managed Care, Other (non HMO) | Admitting: Family

## 2020-03-27 ENCOUNTER — Encounter: Payer: Self-pay | Admitting: Family

## 2020-03-27 DIAGNOSIS — J069 Acute upper respiratory infection, unspecified: Secondary | ICD-10-CM

## 2020-03-27 DIAGNOSIS — R059 Cough, unspecified: Secondary | ICD-10-CM | POA: Diagnosis not present

## 2020-03-27 NOTE — Progress Notes (Signed)
   Virtual Visit via telephone Note Due to COVID-19 pandemic this visit was conducted virtually. This visit type was conducted due to national recommendations for restrictions regarding the COVID-19 Pandemic (e.g. social distancing, sheltering in place) in an effort to limit this patient's exposure and mitigate transmission in our community. All issues noted in this document were discussed and addressed.  A physical exam was not performed with this format.  I connected with [name] on 03/27/20 at 11:04 AM  by telephone and verified that I am speaking with the correct person using two identifiers. Chloe Byrd is currently located at home and no one is currently with her during visit. The provider, Jannifer Rodney, FNP is located in their office at time of visit.  I discussed the limitations, risks, security and privacy concerns of performing an evaluation and management service by telephone and the availability of in person appointments. I also discussed with the patient that there may be a patient responsible charge related to this service. The patient expressed understanding and agreed to proceed.   History and Present Illness:  Cough This is a new problem. The cough is non-productive. Associated symptoms include ear congestion, headaches, myalgias, nasal congestion and a sore throat. Pertinent negatives include no chills, fever, shortness of breath or wheezing. The symptoms are aggravated by lying down. She has tried rest for the symptoms. The treatment provided mild relief.      Review of Systems  Constitutional: Negative for chills and fever.  HENT: Positive for sore throat.   Respiratory: Positive for cough. Negative for shortness of breath and wheezing.   Musculoskeletal: Positive for myalgias.  Neurological: Positive for headaches.     Observations/Objective: No SOB or distress noted, hoarse voice  Assessment and Plan: 1. Cough - Novel Coronavirus, NAA  ([facility])  2. Viral URI - Novel Coronavirus, NAA ([facility])  Pt will come and get tested Quarantine until results  - Take meds as prescribed - Use a cool mist humidifier  -Use saline nose sprays frequently -Force fluids -For any cough or congestion  Use plain Mucinex- regular strength or max strength is fine -For fever or aces or pains- take tylenol or ibuprofen. -Throat lozenges if help RTO if symptoms worsen or do not improve    I discussed the assessment and treatment plan with the patient. The patient was provided an opportunity to ask questions and all were answered. The patient agreed with the plan and demonstrated an understanding of the instructions.   The patient was advised to call back or seek an in-person evaluation if the symptoms worsen or if the condition fails to improve as anticipated.  The above assessment and management plan was discussed with the patient. The patient verbalized understanding of and has agreed to the management plan. Patient is aware to call the clinic if symptoms persist or worsen. Patient is aware when to return to the clinic for a follow-up visit. Patient educated on when it is appropriate to go to the emergency department.   Time call ended:  11:15 AM   I provided 11 minutes of non-face-to-face time during this encounter.    Jannifer Rodney, FNP

## 2020-03-29 LAB — SARS-COV-2, NAA 2 DAY TAT

## 2020-03-29 LAB — NOVEL CORONAVIRUS, NAA: SARS-CoV-2, NAA: DETECTED — AB

## 2020-07-24 ENCOUNTER — Encounter: Payer: Self-pay | Admitting: Family Medicine

## 2020-07-28 ENCOUNTER — Encounter: Payer: Self-pay | Admitting: Family Medicine

## 2020-11-10 LAB — EXTERNAL GENERIC LAB PROCEDURE: COLOGUARD: NEGATIVE

## 2020-11-10 LAB — COLOGUARD: COLOGUARD: NEGATIVE

## 2021-01-21 ENCOUNTER — Telehealth: Payer: Managed Care, Other (non HMO) | Admitting: Emergency Medicine

## 2021-01-21 DIAGNOSIS — Z207 Contact with and (suspected) exposure to pediculosis, acariasis and other infestations: Secondary | ICD-10-CM

## 2021-01-21 DIAGNOSIS — N3000 Acute cystitis without hematuria: Secondary | ICD-10-CM

## 2021-01-21 DIAGNOSIS — R21 Rash and other nonspecific skin eruption: Secondary | ICD-10-CM

## 2021-01-21 DIAGNOSIS — R300 Dysuria: Secondary | ICD-10-CM

## 2021-01-21 MED ORDER — NITROFURANTOIN MONOHYD MACRO 100 MG PO CAPS: 100.0000 mg | ORAL_CAPSULE | Freq: Two times a day (BID) | ORAL | 0 refills | Status: AC

## 2021-01-21 MED ORDER — PERMETHRIN 5 % EX CREA: TOPICAL_CREAM | CUTANEOUS | 0 refills | Status: DC

## 2021-01-21 NOTE — Progress Notes (Signed)
I have spent 5 minutes in review of e-visit questionnaire, review and updating patient chart, medical decision making and response to patient.   Brittnee Gaetano, PA-C    

## 2021-01-21 NOTE — Progress Notes (Signed)
E-Visit for Scabies  We are sorry that you are not feeling well. Here is how we plan to help!  Based on what you shared with me it looks like you have scabies.  Scabies is an infection caused by very tiny mites that burrow into the skin.  They cause severe itching. Though children are most commonly infected, anyone can get scabies.  Scabies mites can pass from person to person through physical close contact.  They can also be passed through shared clothing, towels, and bedding.  Scabies infection is not usually dangerous, but it is uncomfortable.  Because it is so contagious, scabies should be treated immediately to keep the infection from spreading.  If you have children in day care, please have any exposed children examined by their pediatrician. .   I have prescribed Permethrin topical cream 5% thoroughly massage cream from head to soles of feet; leave on for 8 to 14 hours before removing (shower or bath). May repeat if living mites are observed 14 days after first treatment; one application is generally curative.    HOME CARE:  You should treat all members in your household whether they show symptoms or not. Wash towels, clothes, bed linens, cloth toys, hats, and other personal items in hot water and dry on high heat. Vacuum floors and furniture and throw away the bag afterwards. Keep your child home from day care or school until the morning after treatment for scabies. Notify your child's day care or school so that other children can be checked and treated.  GET HELP RIGHT AWAY IF:  The infected person has a fever, red streaks, pain, or swelling of the skin. Sores get worse or don't heal. New rashes appear or itching continues for more than 2 weeks after treatment.  MAKE SURE YOU:  Understand these instructions. Will watch your condition. Will get help right away if you are not doing well or get worse.  Thank you for choosing an e-visit.  Your e-visit answers were reviewed by a  board certified advanced clinical practitioner to complete your personal care plan. Depending upon the condition, your plan could have included both over the counter or prescription medications.  Please review your pharmacy choice. Make sure the pharmacy is open so you can pick up prescription now. If there is a problem, you may contact your provider through MyChart messaging and have the prescription routed to another pharmacy.  Your safety is important to us. If you have drug allergies check your prescription carefully.   For the next 24 hours you can use MyChart to ask questions about today's visit, request a non-urgent call back, or ask for a work or school excuse. You will get an email in the next two days asking about your experience. I hope that your e-visit has been valuable and will speed your recovery.  

## 2021-01-21 NOTE — Progress Notes (Signed)
I have spent 5 minutes in review of e-visit questionnaire, review and updating patient chart, medical decision making and response to patient.   Lamika Connolly, PA-C    

## 2021-01-21 NOTE — Progress Notes (Deleted)

## 2021-01-21 NOTE — Progress Notes (Signed)

## 2021-02-06 ENCOUNTER — Telehealth: Payer: Managed Care, Other (non HMO) | Admitting: Physician Assistant

## 2021-02-06 DIAGNOSIS — L719 Rosacea, unspecified: Secondary | ICD-10-CM

## 2021-02-06 NOTE — Progress Notes (Signed)
Based on what you shared with me, I feel your condition warrants further evaluation and I recommend that you be seen in a face to face visit.  I would recommend a face to face visit. You have done two rounds of appropriate medical therapy without resolution and at this point I think that you would be better served by being seen for an in person visit by your dermatologist or primary care provider.   NOTE: There will be NO CHARGE for this eVisit   If you are having a true medical emergency please call 911.      For an urgent face to face visit, [facility] has six urgent care centers for your convenience:     [facility] at [facility] Directions [phone] [address] [place], [place] [address]    [facility]) Get Driving Directions [phone] [address] [place], [place] [address]  [facility]) Get Driving Directions [phone] [address] [place],  [place]  [address]  [facility] at [facility] Get Driving Directions [phone] [address] [place], [place] [address]   [facility] Urgent Care at [facility] Get Driving Directions  [phone] [address] [place], [place] [address]   [facility] Urgent Care at [facility] Directions [phone] [address] [place], [place] [address]  Your MyChart E-visit questionnaire answers were reviewed by a board certified advanced clinical practitioner to complete your personal care plan based on your specific symptoms.  Thank you for using e-Visits.   Approximately 5 minutes was spent documenting and reviewing patient's chart.

## 2021-06-28 ENCOUNTER — Other Ambulatory Visit: Payer: Self-pay | Admitting: Adult Health Nurse Practitioner

## 2021-06-28 DIAGNOSIS — Z1231 Encounter for screening mammogram for malignant neoplasm of breast: Secondary | ICD-10-CM

## 2021-08-29 ENCOUNTER — Ambulatory Visit
Admission: RE | Admit: 2021-08-29 | Discharge: 2021-08-29 | Disposition: A | Discharge status: Home / Self Care | Payer: Managed Care, Other (non HMO) | Source: Ambulatory Visit | Attending: Adult Health Nurse Practitioner | Admitting: Adult Health Nurse Practitioner

## 2021-08-29 DIAGNOSIS — Z1231 Encounter for screening mammogram for malignant neoplasm of breast: Secondary | ICD-10-CM

## 2021-12-05 ENCOUNTER — Telehealth: Payer: Managed Care, Other (non HMO) | Admitting: Physician Assistant

## 2021-12-05 DIAGNOSIS — T3695XA Adverse effect of unspecified systemic antibiotic, initial encounter: Secondary | ICD-10-CM

## 2021-12-05 DIAGNOSIS — B379 Candidiasis, unspecified: Secondary | ICD-10-CM | POA: Diagnosis not present

## 2021-12-05 MED ORDER — FLUCONAZOLE 150 MG PO TABS: 150.0000 mg | ORAL_TABLET | Freq: Once | ORAL | 0 refills | Status: AC

## 2021-12-05 NOTE — Progress Notes (Signed)

## 2021-12-05 NOTE — Progress Notes (Signed)
I have spent 5 minutes in review of e-visit questionnaire, review and updating patient chart, medical decision making and response to patient.   Zebastian Carico Cody Jelan Batterton, PA-C    

## 2022-02-19 ENCOUNTER — Telehealth: Payer: Managed Care, Other (non HMO) | Admitting: Family Medicine

## 2022-02-19 DIAGNOSIS — R3989 Other symptoms and signs involving the genitourinary system: Secondary | ICD-10-CM | POA: Diagnosis not present

## 2022-02-19 MED ORDER — NITROFURANTOIN MONOHYD MACRO 100 MG PO CAPS
100.0000 mg | ORAL_CAPSULE | Freq: Two times a day (BID) | ORAL | 0 refills | Status: AC
Start: 2022-02-19 — End: 2022-02-24

## 2022-02-19 NOTE — Progress Notes (Signed)

## 2022-09-16 IMAGING — MG MM DIGITAL SCREENING BILAT W/ TOMO AND CAD
6 of 10 series · 6 of 30 positions shown · non-contrast
Comparison: Previous exam(s).

CLINICAL DATA: Screening.

EXAM:
DIGITAL SCREENING BILATERAL MAMMOGRAM WITH TOMOSYNTHESIS AND CAD
TECHNIQUE: Bilateral screening digital craniocaudal and mediolateral oblique
mammograms were obtained. Bilateral screening digital breast
tomosynthesis was performed. The images were evaluated with
computer-aided detection.

[R MLO synth-2D]
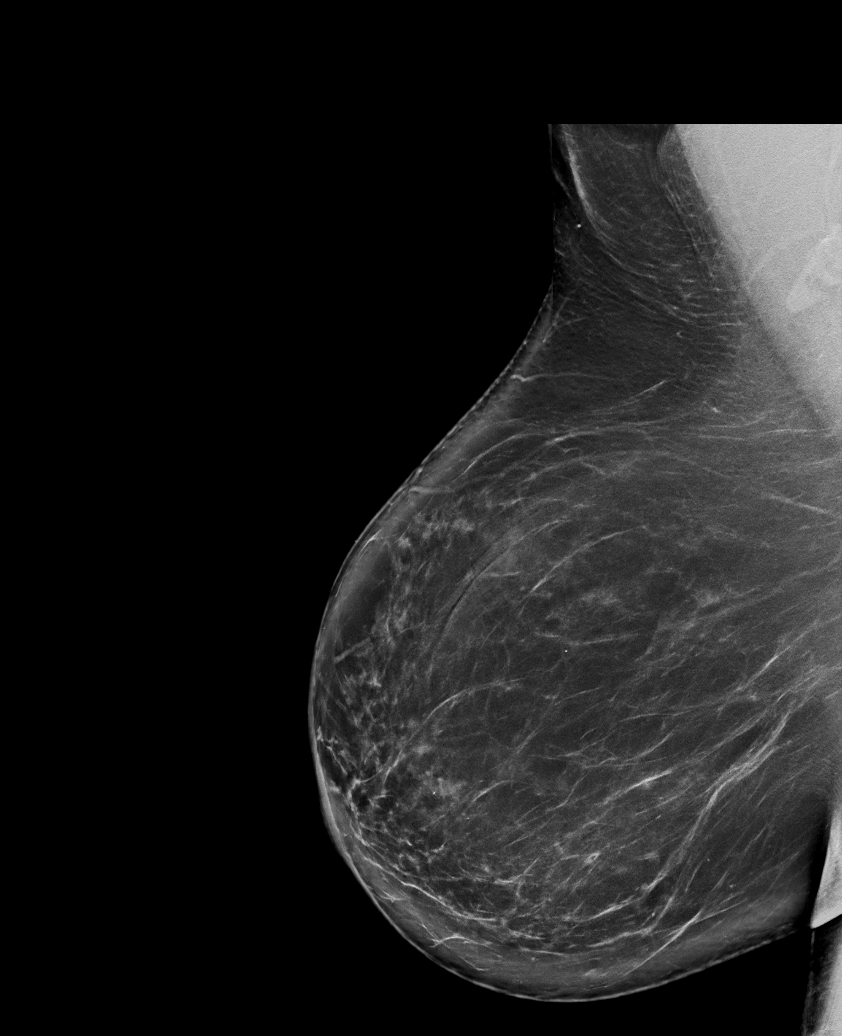

[L CC synth-2D]
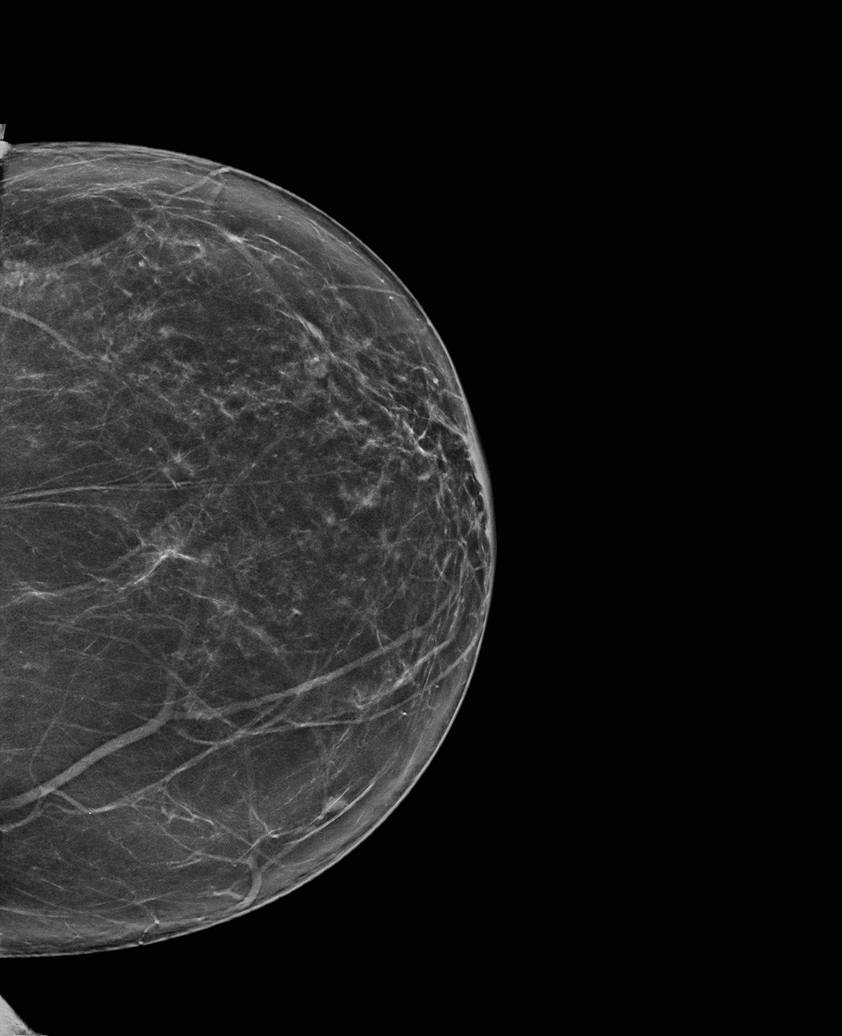

[L MLO synth-2D (1 of 2)]
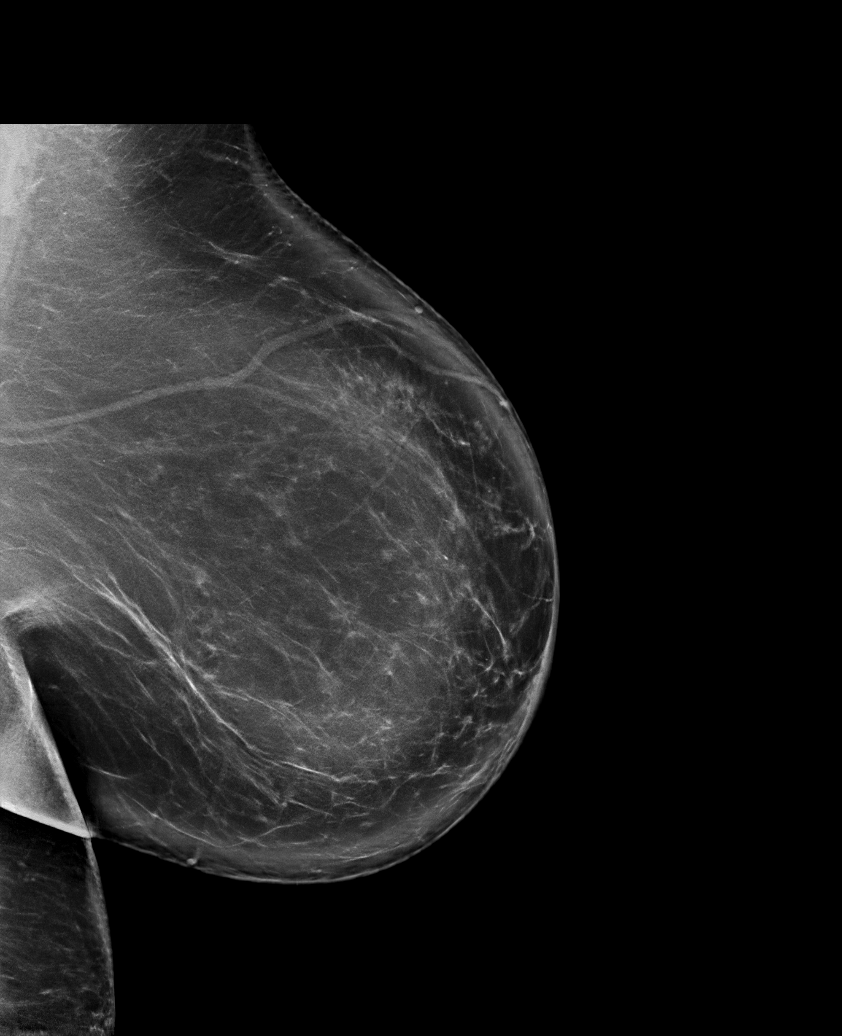

[R CC synth-2D]
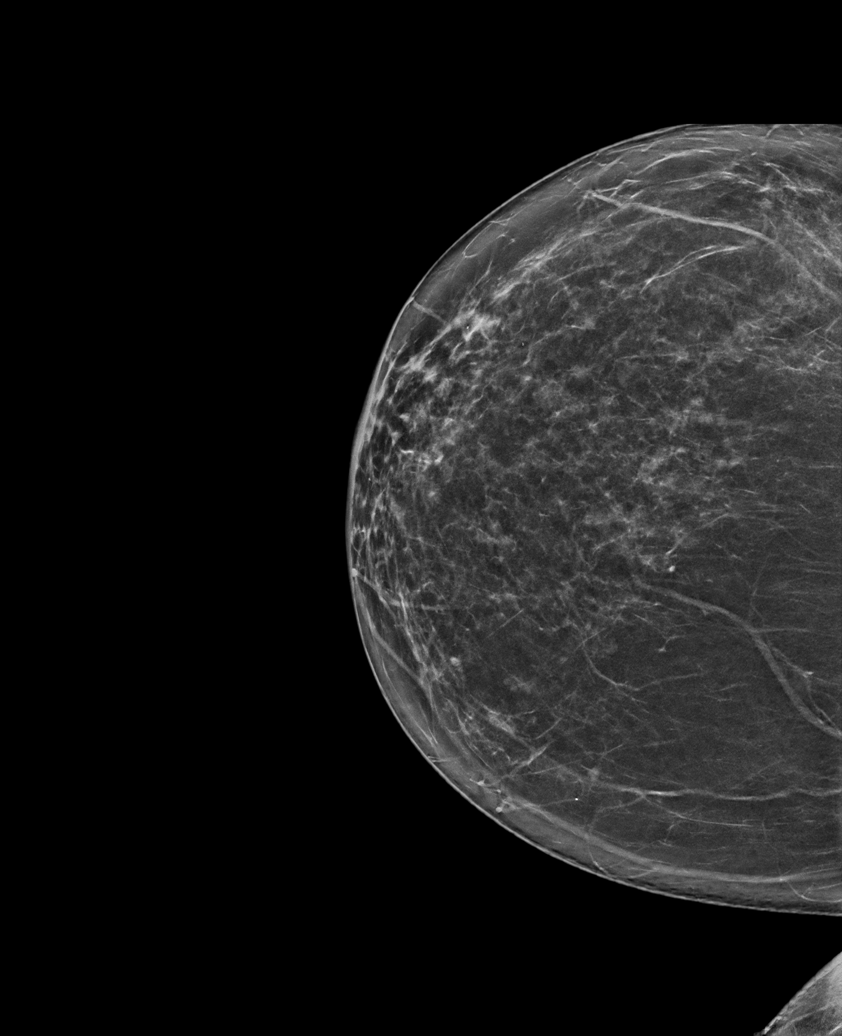

[L MLO synth-2D (2 of 2)]
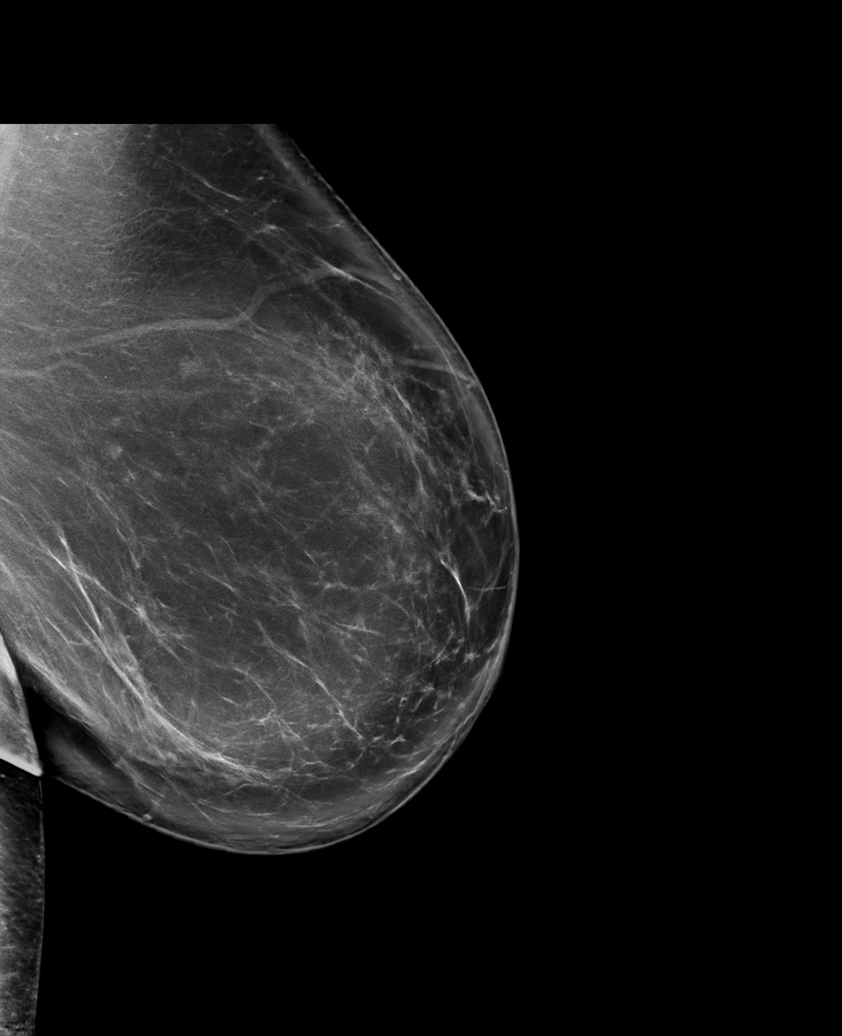

[L MLO tomo · tomo slice 55/110.0]
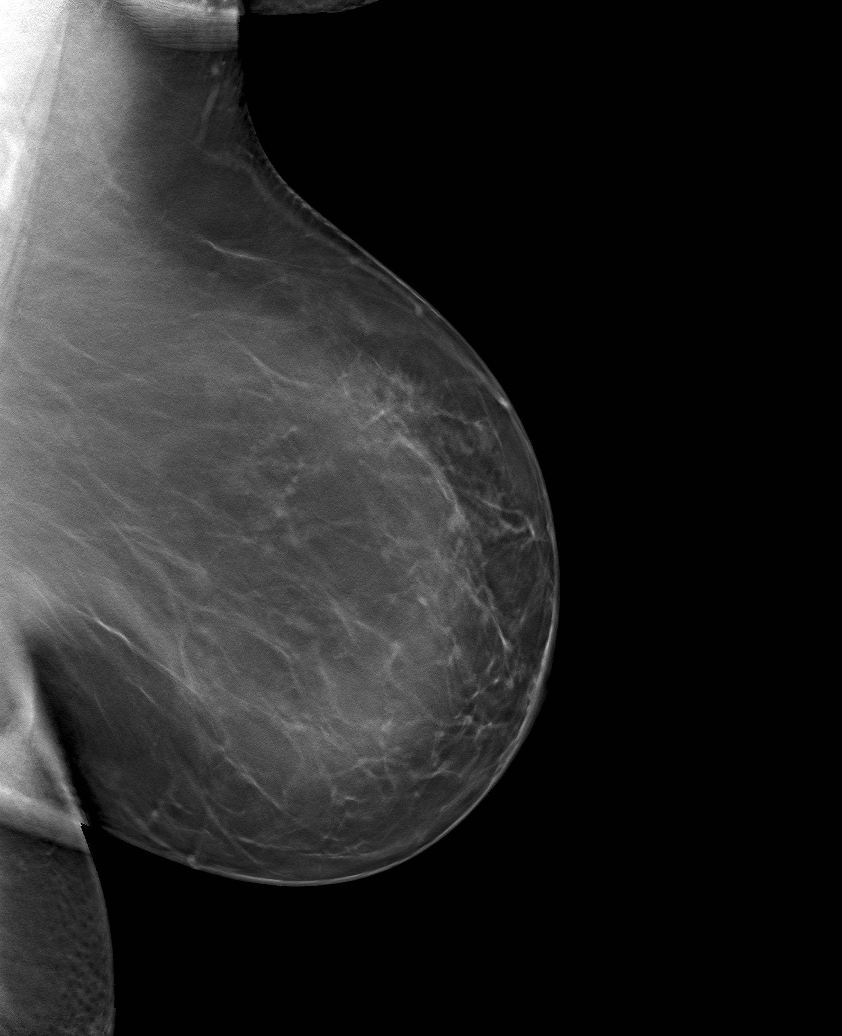

[6 of 30 positions shown; findings below may reference images not displayed]

ACR Breast Density Category b: There are scattered areas of
fibroglandular density.
FINDINGS: There are no findings suspicious for malignancy.
IMPRESSION: No mammographic evidence of malignancy. A result letter of this
screening mammogram will be mailed directly to the patient.

RECOMMENDATION:
Screening mammogram in one year. (Code:51-O-LD2)

BI-RADS CATEGORY  1: Negative.
